# Patient Record
Sex: Male | Born: 1955 | Race: White | Hispanic: No | Smoking: Never smoker
Health system: Southern US, Community
[De-identification: ages and names within clinical notes are randomized; demographics above are authoritative.]

---

## 1997-12-01 ENCOUNTER — Emergency Department (HOSPITAL_COMMUNITY): Admission: EM | Admit: 1997-12-01 | Discharge: 1997-12-01 | Payer: Self-pay | Admitting: Emergency Medicine

## 2008-09-22 ENCOUNTER — Emergency Department (HOSPITAL_COMMUNITY): Admission: EM | Admit: 2008-09-22 | Discharge: 2008-09-22 | Payer: Self-pay | Admitting: Emergency Medicine

## 2009-05-18 ENCOUNTER — Emergency Department (HOSPITAL_COMMUNITY): Admission: EM | Admit: 2009-05-18 | Discharge: 2009-05-18 | Payer: Self-pay | Admitting: Emergency Medicine

## 2009-05-19 ENCOUNTER — Ambulatory Visit (HOSPITAL_COMMUNITY): Admission: RE | Admit: 2009-05-19 | Discharge: 2009-05-19 | Payer: Self-pay | Admitting: Ophthalmology

## 2010-07-18 IMAGING — CT CT PELVIS W/O CM
1 series · 14 of 32 positions shown, 18 images · non-contrast
Comparison: None.

CT ABDOMEN

CLINICAL DATA: Left flank pain radiating into the left groin.

CT ABDOMEN AND PELVIS WITHOUT CONTRAST 09/22/2008:
TECHNIQUE: Multidetector CT imaging of the abdomen and pelvis was
performed following the standard protocol without intravenous
contrast.

[Series 4: lung windows · axial · 0.80mm/px · z∈[-479,-59]mm · 14 of 95 slices shown, 18 images]
[im 7/95  soft-tissue]
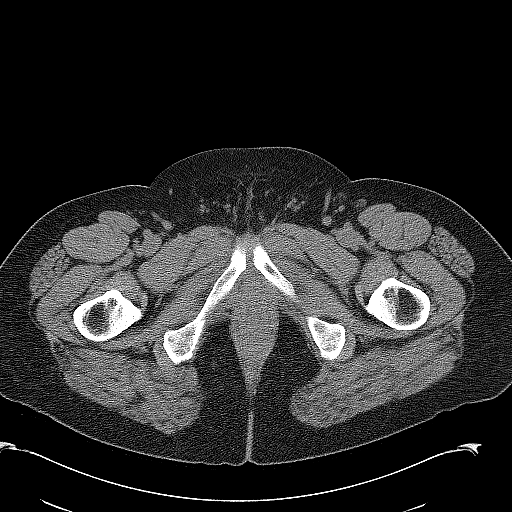
[im 7/95  bone]
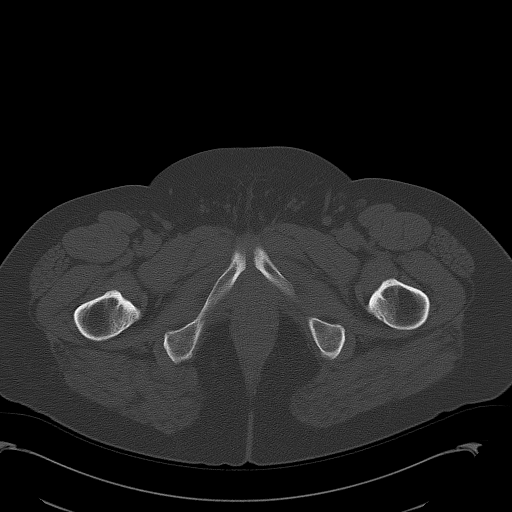
[im 13/95  soft-tissue]
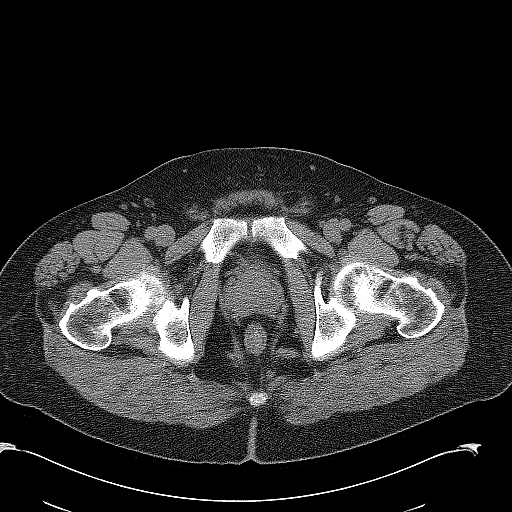
[im 22/95  soft-tissue]
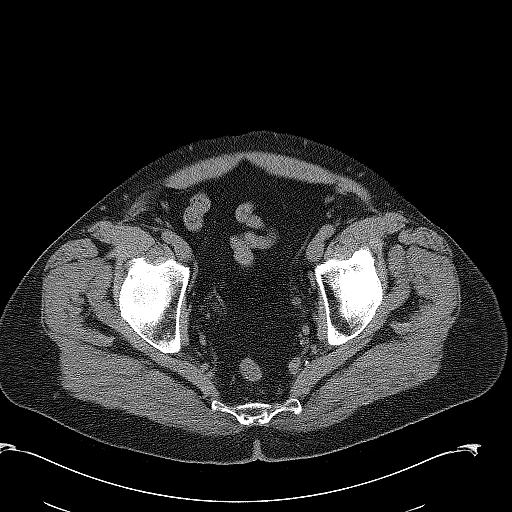
[im 28/95  soft-tissue]
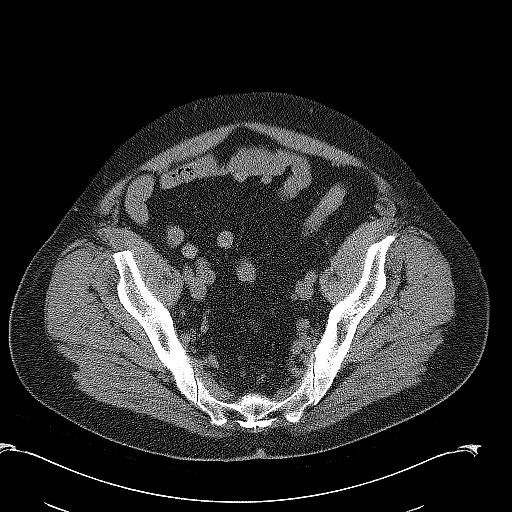
[im 37/95  soft-tissue]
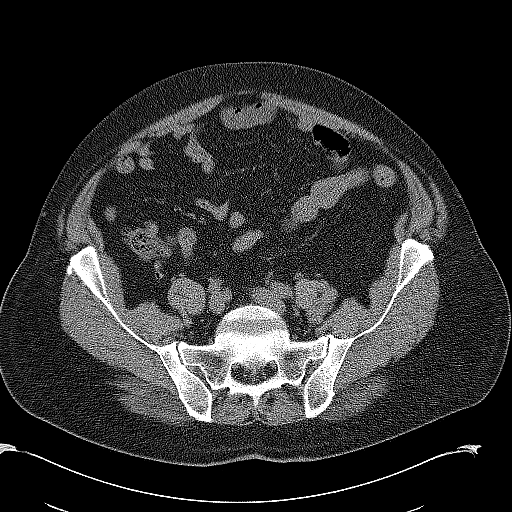
[im 43/95  soft-tissue]
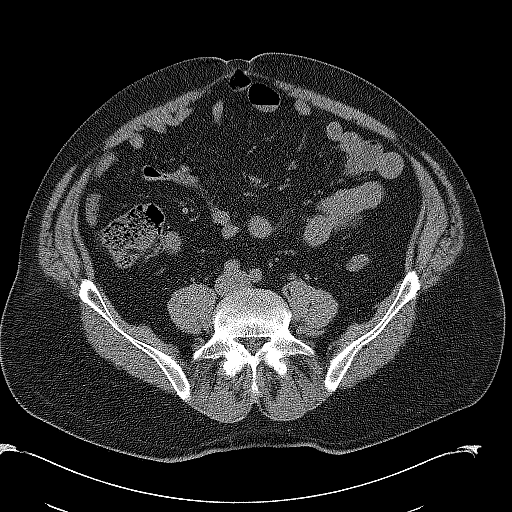
[im 52/95  soft-tissue]
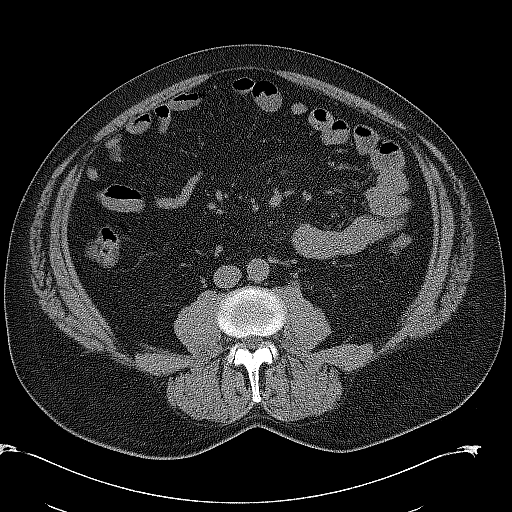
[im 58/95  soft-tissue]
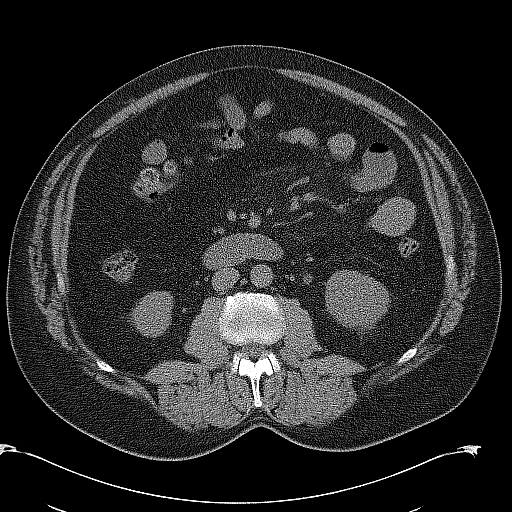
[im 67/95  soft-tissue]
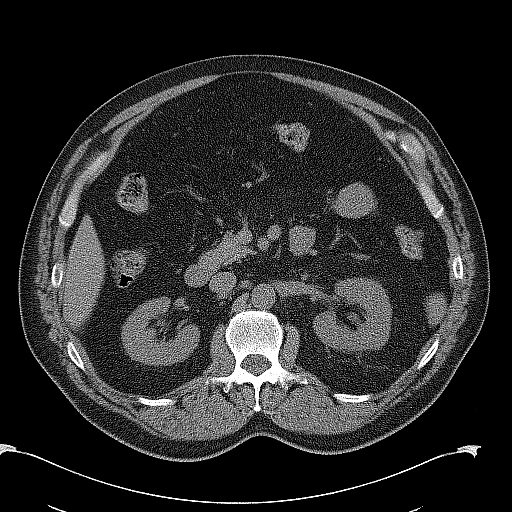
[im 67/95  bone]
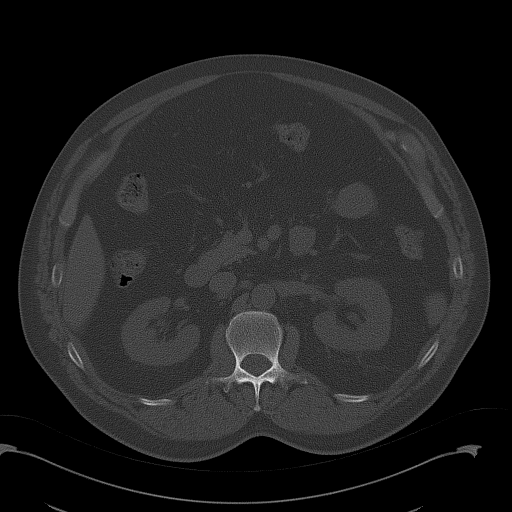
[im 73/95  soft-tissue]
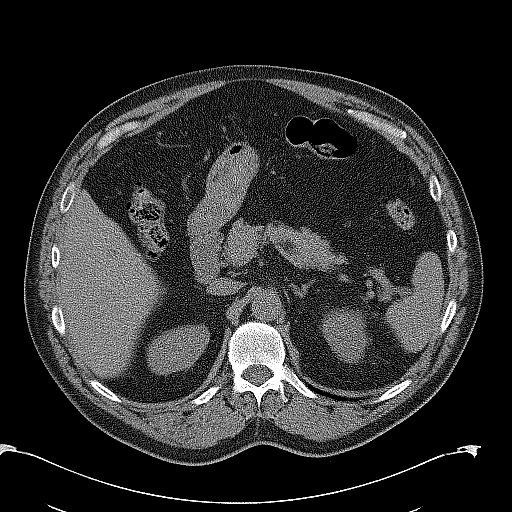
[im 82/95  soft-tissue]
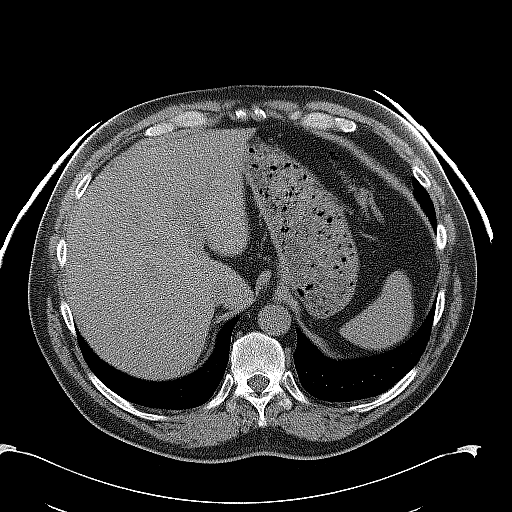
[im 82/95  lung]
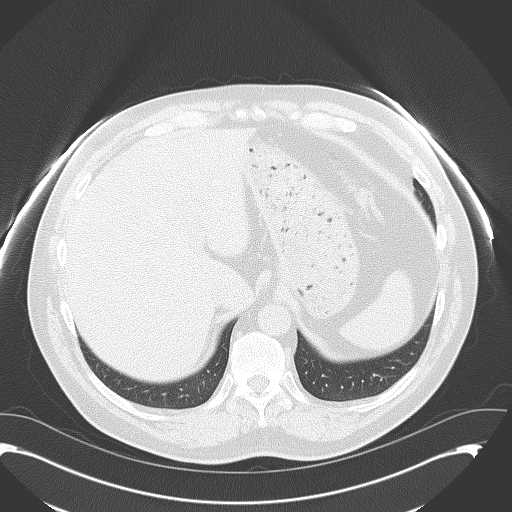
[im 85/95  lung]
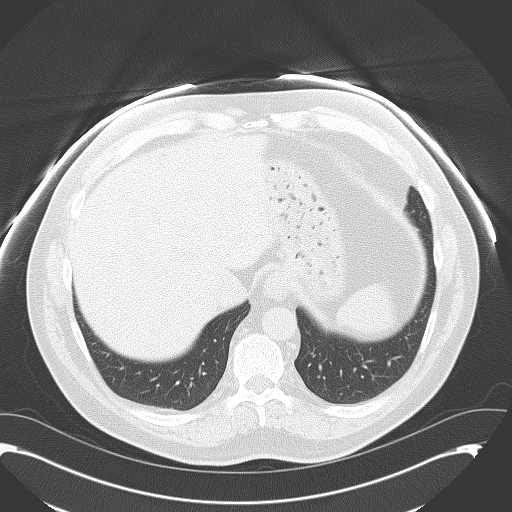
[im 88/95  soft-tissue]
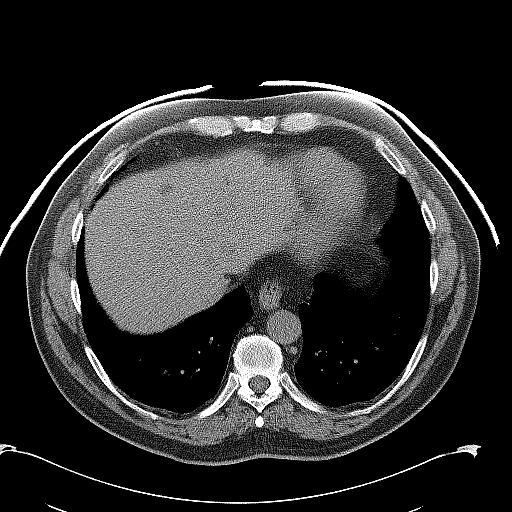
[im 88/95  lung]
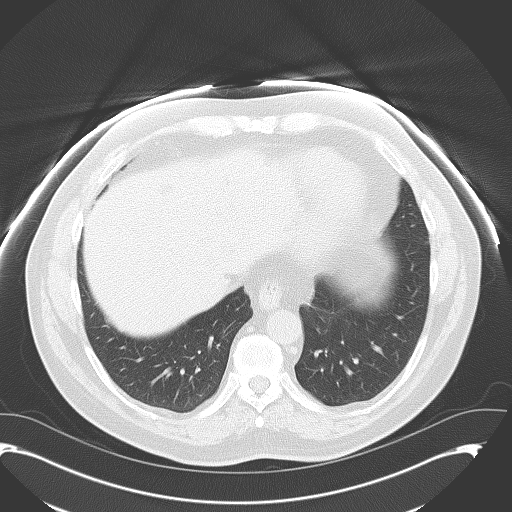
[im 91/95  lung]
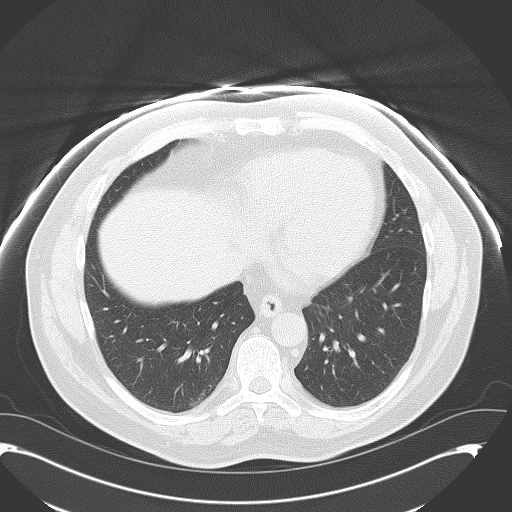

[14 of 32 positions shown; findings below may reference images not displayed]

FINDINGS: Mild left hydronephrosis and dilation of the proximal
left ureter.  No proximal left ureteral calculus.  Approximate 7 mm
calculus in a lower pole calix of the left kidney.  No right upper
urinary tract calculi or obstruction.  Hyperdense cyst arising from
the mid left kidney laterally.  Small hyperdense cyst arising from
the lower pole of the left kidney.  No focal parenchymal
abnormality involving the right kidney.

Numerous low attenuation foci throughout the liver, the largest in
the medial segment left lobe with a Hounsfield measurement of 6,
consistent with a small cyst; I suspect that all of the lesions are
cysts, given their conspicuity for their small size and the
unenhanced technique.  Normal unenhanced appearance of the spleen,
pancreas, and adrenal glands.  Gallbladder unremarkable by CT.  No
biliary ductal dilation.  Stomach and visualized small bowel and
colon unremarkable.  Normal appearing abdominal aorta.  No
significant lymphadenopathy.  No ascites.  Bone window images
demonstrate facet degenerative changes in the lower lumbar spine.
IMPRESSION: 1.  Mild obstruction of the left upper urinary tract.  No proximal
left ureteral calculus.
2.  7 mm left lower pole renal calculus.
3.  Hyperdense cysts (hemorrhagic or proteinaceous) arising from
the midportion and lower pole of the left kidney.
4.  Numerous hepatic cysts.

CT PELVIS
FINDINGS: Approximate 3 mm calculus involving the intramural
portion of the left ureter at the UVJ, accounting for the left
upper urinary tract obstruction.  Urinary bladder decompressed and
unremarkable.  Borderline median lobe prostate gland enlargement.
Seminal vesicles unremarkable.  Minimal left internal iliac artery
atherosclerosis.  No significant lymphadenopathy.  No ascites.
Visualized colon and small bowel unremarkable.  Normal appendix in
the right upper pelvis.  Bone window images suggest early
degenerative changes in the hips.
IMPRESSION: 1.  Obstructing approximate 3 mm left ureteral vesicle junction
calculus.
2.  Borderline median lobe prostate gland enlargement.

## 2010-08-29 LAB — CBC
HCT: 45.9 % (ref 39.0–52.0)
Hemoglobin: 15.7 g/dL (ref 13.0–17.0)
MCV: 85.6 fL (ref 78.0–100.0)
WBC: 13 10*3/uL — ABNORMAL HIGH (ref 4.0–10.5)

## 2010-08-29 LAB — BASIC METABOLIC PANEL
CO2: 23 mEq/L (ref 19–32)
Chloride: 106 mEq/L (ref 96–112)
Potassium: 4.1 mEq/L (ref 3.5–5.1)

## 2010-09-07 LAB — URINALYSIS, ROUTINE W REFLEX MICROSCOPIC
Bilirubin Urine: NEGATIVE
Glucose, UA: NEGATIVE mg/dL
Specific Gravity, Urine: 1.028 (ref 1.005–1.030)
pH: 7 (ref 5.0–8.0)

## 2010-09-07 LAB — URINE MICROSCOPIC-ADD ON

## 2010-09-07 LAB — POCT I-STAT, CHEM 8
Glucose, Bld: 135 mg/dL — ABNORMAL HIGH (ref 70–99)
HCT: 45 % (ref 39.0–52.0)
Hemoglobin: 15.3 g/dL (ref 13.0–17.0)
Potassium: 4.1 mEq/L (ref 3.5–5.1)
Sodium: 141 mEq/L (ref 135–145)

## 2010-12-15 ENCOUNTER — Inpatient Hospital Stay (INDEPENDENT_AMBULATORY_CARE_PROVIDER_SITE_OTHER)
Admission: RE | Admit: 2010-12-15 | Discharge: 2010-12-15 | Disposition: A | Discharge status: Home / Self Care | Payer: Self-pay | Source: Ambulatory Visit | Attending: Emergency Medicine | Admitting: Emergency Medicine

## 2010-12-15 DIAGNOSIS — N201 Calculus of ureter: Secondary | ICD-10-CM

## 2010-12-15 LAB — POCT URINALYSIS DIP (DEVICE)
Glucose, UA: NEGATIVE mg/dL
Ketones, ur: NEGATIVE mg/dL
Leukocytes, UA: NEGATIVE
Protein, ur: 100 mg/dL — AB

## 2011-03-14 IMAGING — CR DG CHEST 2V
2 series · 2 of 2 positions shown · non-contrast
Comparison: None

CLINICAL DATA: Preoperative evaluation for detached retina.
Nonsmoker.  No hypertension

CHEST - 2 VIEW

[view not recorded (1 of 2)]
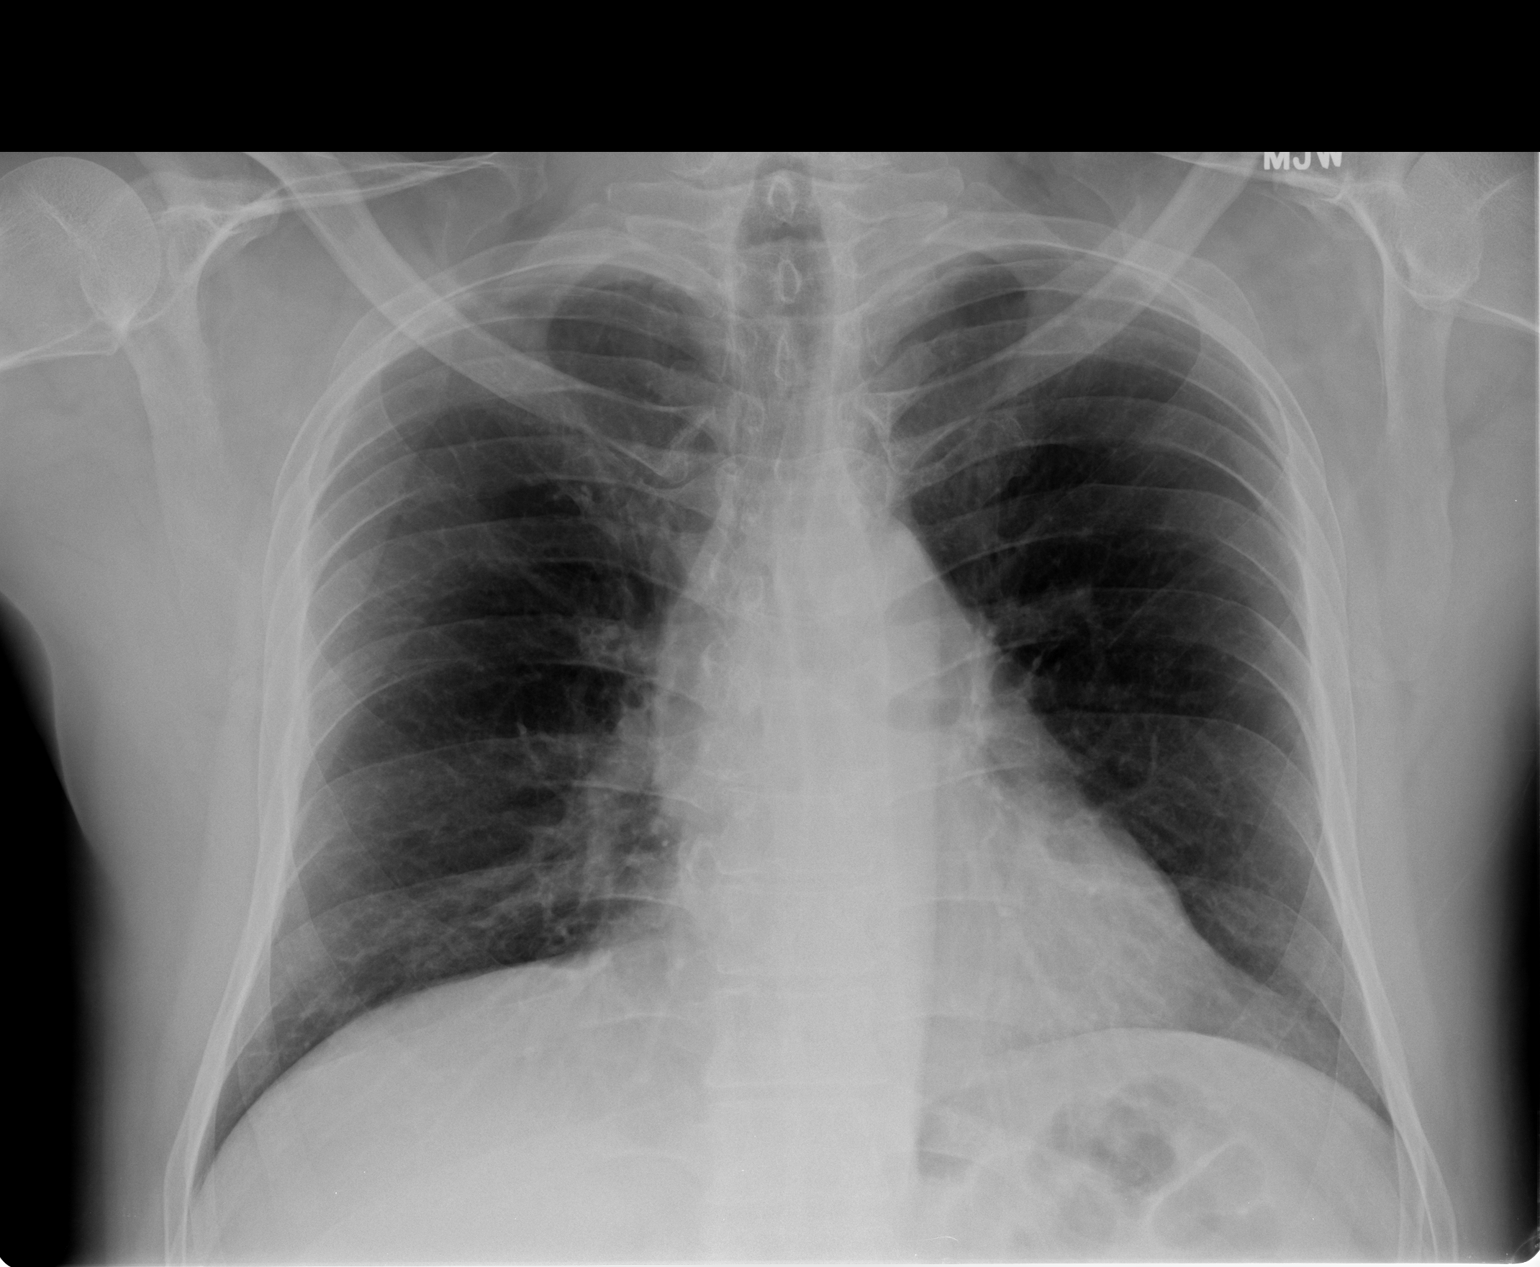

[view not recorded (2 of 2)]
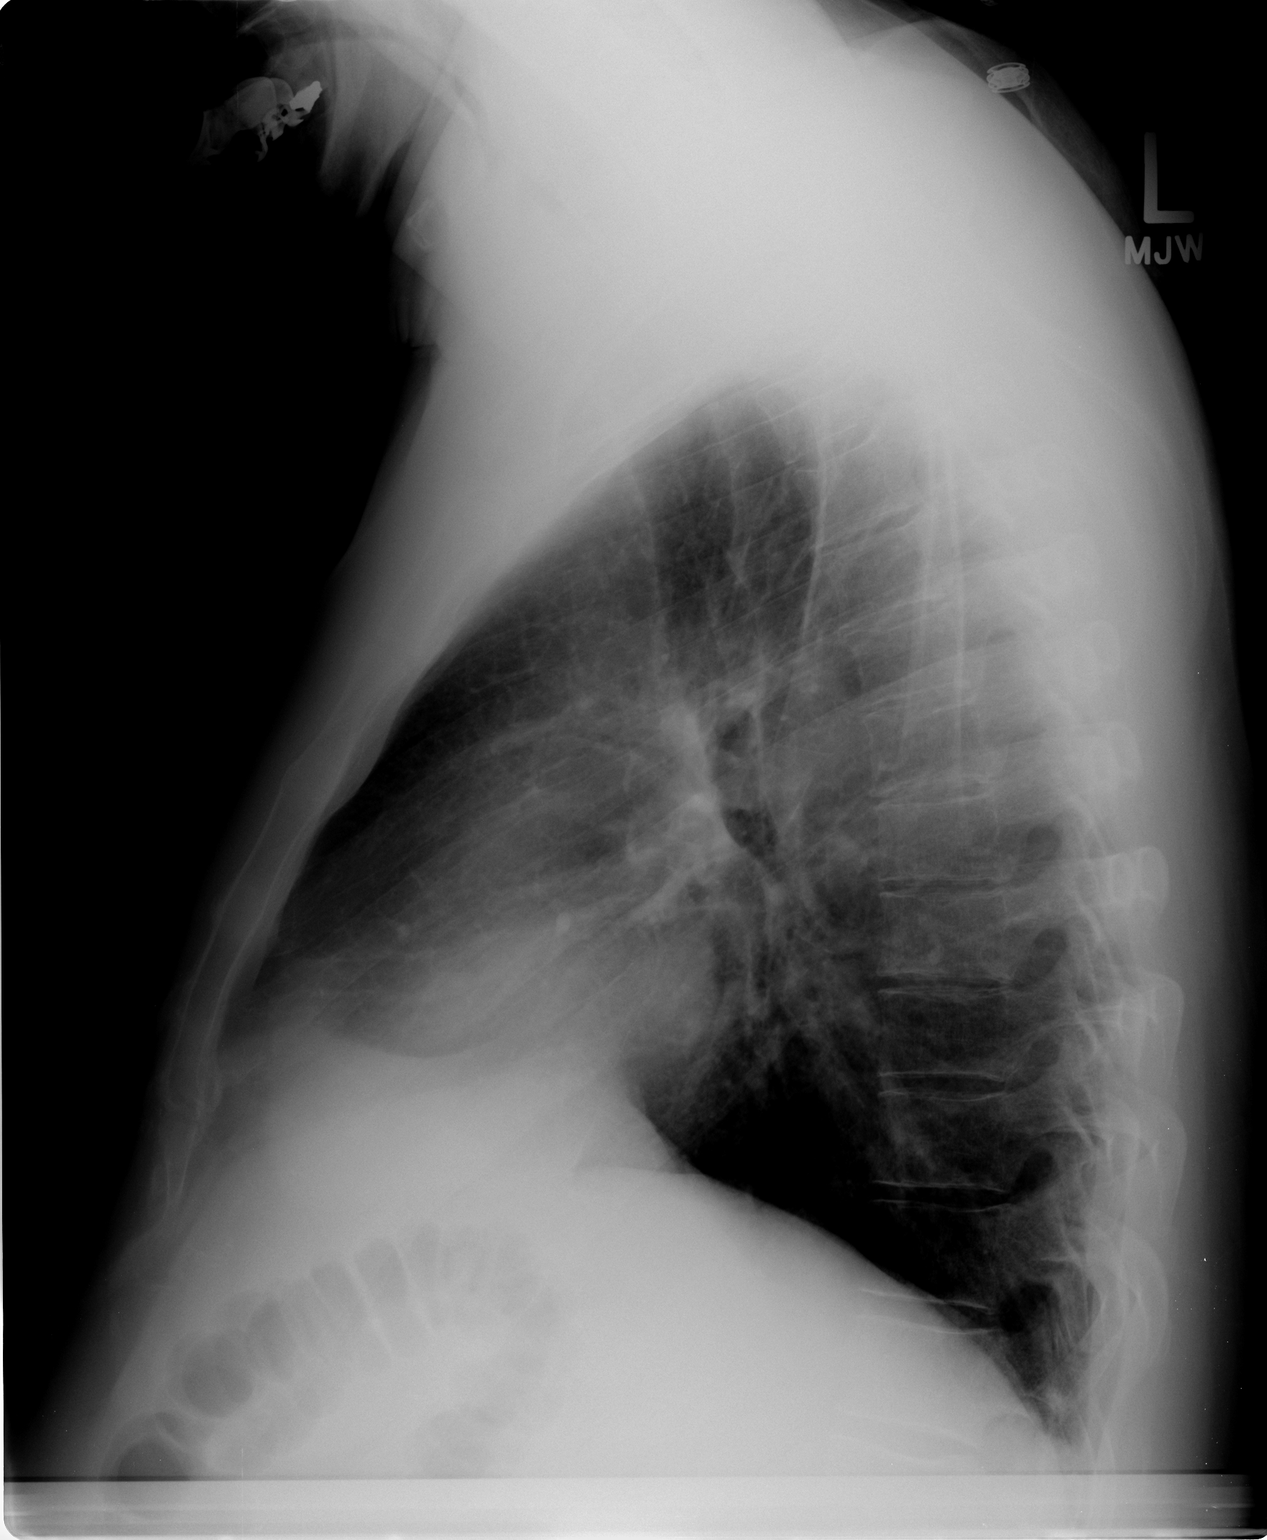

[2 of 2 positions shown; findings below may reference images not displayed]

FINDINGS: Heart size is within normal limits.  A normal mediastinal
configuration is seen.  The lung fields appear clear with no signs
of focal infiltrate or congestive failure.  Some mild peribronchial
cuffing is seen centrally.  No pleural fluid is seen.  Bony
structures appear intact. Mild bilateral apical pleural thickening
is seen right greater than left.
IMPRESSION: Mild central peribronchial cuffing which can be seen with
bronchitis or chronic change.  Otherwise unremarkable.

## 2020-03-08 ENCOUNTER — Encounter (HOSPITAL_COMMUNITY): Payer: Self-pay

## 2020-03-08 ENCOUNTER — Other Ambulatory Visit: Payer: Self-pay

## 2020-03-08 ENCOUNTER — Inpatient Hospital Stay (HOSPITAL_COMMUNITY)
Admission: EM | Admit: 2020-03-08 | Discharge: 2020-03-10 | DRG: 812 | Disposition: A | Discharge status: Home / Self Care | Payer: Medicare Other | Attending: Internal Medicine | Admitting: Internal Medicine

## 2020-03-08 DIAGNOSIS — Z20822 Contact with and (suspected) exposure to covid-19: Secondary | ICD-10-CM | POA: Diagnosis present

## 2020-03-08 DIAGNOSIS — D649 Anemia, unspecified: Secondary | ICD-10-CM | POA: Diagnosis not present

## 2020-03-08 DIAGNOSIS — R079 Chest pain, unspecified: Secondary | ICD-10-CM

## 2020-03-08 DIAGNOSIS — K648 Other hemorrhoids: Secondary | ICD-10-CM | POA: Diagnosis present

## 2020-03-08 DIAGNOSIS — D509 Iron deficiency anemia, unspecified: Secondary | ICD-10-CM | POA: Diagnosis not present

## 2020-03-08 DIAGNOSIS — Z7982 Long term (current) use of aspirin: Secondary | ICD-10-CM

## 2020-03-08 DIAGNOSIS — K449 Diaphragmatic hernia without obstruction or gangrene: Secondary | ICD-10-CM | POA: Diagnosis present

## 2020-03-08 DIAGNOSIS — K2970 Gastritis, unspecified, without bleeding: Secondary | ICD-10-CM | POA: Diagnosis present

## 2020-03-08 DIAGNOSIS — K317 Polyp of stomach and duodenum: Secondary | ICD-10-CM | POA: Diagnosis present

## 2020-03-08 LAB — ABO/RH: ABO/RH(D): A POS

## 2020-03-08 LAB — CBC
HCT: 20.1 % — ABNORMAL LOW (ref 39.0–52.0)
Hemoglobin: 5.3 g/dL — CL (ref 13.0–17.0)
MCH: 15.8 pg — ABNORMAL LOW (ref 26.0–34.0)
MCHC: 26.4 g/dL — ABNORMAL LOW (ref 30.0–36.0)
MCV: 59.8 fL — ABNORMAL LOW (ref 80.0–100.0)
Platelets: 346 10*3/uL (ref 150–400)
RBC: 3.36 MIL/uL — ABNORMAL LOW (ref 4.22–5.81)
RDW: 24.8 % — ABNORMAL HIGH (ref 11.5–15.5)
WBC: 8.4 10*3/uL (ref 4.0–10.5)
nRBC: 0 % (ref 0.0–0.2)

## 2020-03-08 LAB — COMPREHENSIVE METABOLIC PANEL
ALT: 12 U/L (ref 0–44)
AST: 11 U/L — ABNORMAL LOW (ref 15–41)
Albumin: 4.2 g/dL (ref 3.5–5.0)
Alkaline Phosphatase: 98 U/L (ref 38–126)
Anion gap: 12 (ref 5–15)
BUN: 23 mg/dL (ref 8–23)
CO2: 23 mmol/L (ref 22–32)
Calcium: 8.9 mg/dL (ref 8.9–10.3)
Chloride: 106 mmol/L (ref 98–111)
Creatinine, Ser: 0.89 mg/dL (ref 0.61–1.24)
GFR, Estimated: >60 mL/min (ref >60)
Glucose, Bld: 123 mg/dL — ABNORMAL HIGH (ref 70–99)
Potassium: 3.9 mmol/L (ref 3.5–5.1)
Sodium: 141 mmol/L (ref 135–145)
Total Bilirubin: 0.9 mg/dL (ref 0.3–1.2)
Total Protein: 7.5 g/dL (ref 6.5–8.1)

## 2020-03-08 LAB — RETICULOCYTES
Immature Retic Fract: 29.9 % — ABNORMAL HIGH (ref 2.3–15.9)
RBC.: 3.39 MIL/uL — ABNORMAL LOW (ref 4.22–5.81)
Retic Count, Absolute: 75.9 10*3/uL (ref 19.0–186.0)
Retic Ct Pct: 2.2 % (ref 0.4–3.1)

## 2020-03-08 LAB — URINALYSIS, ROUTINE W REFLEX MICROSCOPIC
Bilirubin Urine: NEGATIVE
Glucose, UA: NEGATIVE mg/dL
Hgb urine dipstick: NEGATIVE
Ketones, ur: NEGATIVE mg/dL
Leukocytes,Ua: NEGATIVE
Nitrite: NEGATIVE
Protein, ur: NEGATIVE mg/dL
Specific Gravity, Urine: 1.012 (ref 1.005–1.030)
pH: 6 (ref 5.0–8.0)

## 2020-03-08 LAB — RESP PANEL BY RT PCR (RSV, FLU A&B, COVID)
Influenza A by PCR: NEGATIVE
Influenza B by PCR: NEGATIVE
Respiratory Syncytial Virus by PCR: NEGATIVE
SARS Coronavirus 2 by RT PCR: NEGATIVE

## 2020-03-08 LAB — PREPARE RBC (CROSSMATCH)

## 2020-03-08 LAB — POC OCCULT BLOOD, ED: Fecal Occult Bld: NEGATIVE

## 2020-03-08 MED ORDER — SODIUM CHLORIDE 0.9% IV SOLUTION: Freq: Once | INTRAVENOUS | Status: DC

## 2020-03-08 NOTE — ED Triage Notes (Addendum)
Pt arrived POV. Patient sent over from PCP for low labs. Pt went to PCP for chest pain Friday and labs were drawn.   Hemoglobin- 4.9    Denies symptoms.  Denies chest pain, shob, and weakness.   A/ox4 Ambulatory in triage   Pt had a fall 2 months ago.

## 2020-03-08 NOTE — ED Provider Notes (Signed)
[facility]-EMERGENCY DEPT Provider Note   CSN: [id number] Arrival date & time: 03/08/20  1231     History Chief Complaint  Patient presents with  . Abnormal Lab    Low Hemaglobin     Tyler Hebert is a 64 y.o. male.  Patient is a 64 year old male with no significant medical problems who takes no medications at home presenting today due to an abnormal lab.  Patient reports that he was called today while at work and told that his hemoglobin was 4.9 and he needed to go to the emergency room immediately.  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  He reports the pain will be on the left side and become severe and then resolved in 5 minutes or less.  He has not noticed any shortness of breath but has noticed over the last few months he has been tired which he relates to just working hard.  He has had no change in his bowel or bladder habits.  He has had no black stools.  He does not take aspirin or ibuprofen products regularly and is on no anticoagulation.  No prior history of cardiac disease but reports that his dad did have a heart history.  Patient denies alcohol drug or tobacco use.  He currently denies any pain or shortness of breath at this time.  The history is provided by the patient.  Abnormal Lab Time since result:  Friday Patient referred by:  PCP Resulting agency:  External Result type: hematology   Hematology:    Hematocrit:  Low   Hemoglobin:  Low   Other abnormal hematology result:  Hb of 4.9      History reviewed. No pertinent past medical history.  There are no problems to display for this patient.   History reviewed. No pertinent surgical history.     No family history on file.  Social History   Tobacco Use  . Smoking status: Never Smoker  . Smokeless tobacco: Never Used  Substance Use Topics  . Alcohol use: Not on file  .  Drug use: Not on file    Home Medications Prior to Admission medications   Not on File    Allergies    Patient has no allergy information on record.  Review of Systems   Review of Systems  All other systems reviewed and are negative.   Physical Exam Updated Vital Signs BP (!) 144/72 (BP Location: Left Arm)   Pulse 97   Temp 98.1 F (36.7 C) (Oral)   Resp (!) 22   SpO2 96%   Physical Exam Vitals and nursing note reviewed.  Constitutional:      General: He is not in acute distress.    Appearance: Normal appearance. He is well-developed and normal weight.  HENT:     Head: Normocephalic and atraumatic.     Mouth/Throat:     Mouth: Mucous membranes are moist.  Eyes:     Pupils: Pupils are equal, round, and reactive to light.     Comments: Pale conjunctive  Cardiovascular:     Rate and Rhythm: Normal rate and regular rhythm.     Heart sounds: No murmur heard.   Pulmonary:     Effort: Pulmonary effort is normal. No respiratory distress.     Breath sounds: Normal breath sounds. No wheezing or rales.  Abdominal:     General:  There is no distension.     Palpations: Abdomen is soft.     Tenderness: There is no abdominal tenderness. There is no guarding or rebound.  Musculoskeletal:        General: No tenderness. Normal range of motion.     Cervical back: Normal range of motion and neck supple.     Right lower leg: No edema.     Left lower leg: No edema.  Skin:    General: Skin is warm and dry.     Coloration: Skin is pale.     Findings: No erythema or rash.  Neurological:     General: No focal deficit present.     Mental Status: He is alert and oriented to person, place, and time. Mental status is at baseline.  Psychiatric:        Mood and Affect: Mood normal.        Behavior: Behavior normal.        Thought Content: Thought content normal.      ED Results / Procedures / Treatments   Labs (all labs ordered are listed, but only abnormal results are  displayed) Labs Reviewed  CBC - Abnormal; Notable for the following components:      Result Value   RBC 3.36 (*)    Hemoglobin 5.3 (*)    HCT 20.1 (*)    MCV 59.8 (*)    MCH 15.8 (*)    MCHC 26.4 (*)    RDW 24.8 (*)    All other components within normal limits  COMPREHENSIVE METABOLIC PANEL - Abnormal; Notable for the following components:   Glucose, Bld 123 (*)    AST 11 (*)    All other components within normal limits  URINALYSIS, ROUTINE W REFLEX MICROSCOPIC  VITAMIN B12  FOLATE  IRON AND TIBC  FERRITIN  RETICULOCYTES  POC OCCULT BLOOD, ED  TYPE AND SCREEN  PREPARE RBC (CROSSMATCH)    EKG EKG Interpretation  Date/Time:  Monday March 08 2020 12:52:20 EDT Ventricular Rate:  95 PR Interval:    QRS Duration: 100 QT Interval:  362 QTC Calculation: 456 R Axis:   50 Text Interpretation: Sinus rhythm 12 Lead; Mason-Likar No significant change since last tracing Confirmed by Gwyneth Sprout ([id number]) on 03/08/2020 1:36:07 PM   Radiology No results found.  Procedures Procedures (including critical care time)  Medications Ordered in ED Medications  0.9 %  sodium chloride infusion (Manually program via Guardrails IV Fluids) (has no administration in time range)    ED Course  I have reviewed the triage vital signs and the nursing notes.  Pertinent labs & imaging results that were available during my care of the patient were reviewed by me and considered in my medical decision making (see chart for details).    MDM Rules/Calculators/A&P                          Patient presenting today with anemia with a hemoglobin on recheck of 5.3 of unknown origin.  Patient is Hemoccult negative and denies any recent stool changes.  No evidence of thrombocytopenia or leukopenia.  Patient has no abdominal pain and at this time denies any chest pain or shortness of breath.  Patient will need transfusion but also anemia panel was sent.  Will call for admission.  CMP without acute  findings.  MDM Number of Diagnoses or Management Options   Amount and/or Complexity of Data Reviewed Clinical lab tests: ordered and reviewed  Tests in the medicine section of CPT: ordered and reviewed Independent visualization of images, tracings, or specimens: yes  Risk of Complications, Morbidity, and/or Mortality Presenting problems: moderate Diagnostic procedures: low Management options: moderate  Patient Progress Patient progress: stable  CRITICAL CARE Performed by: Chanie Soucek Total critical care time: 30 minutes Critical care time was exclusive of separately billable procedures and treating other patients. Critical care was necessary to treat or prevent imminent or life-threatening deterioration. Critical care was time spent personally by me on the following activities: development of treatment plan with patient and/or surrogate as well as nursing, discussions with consultants, evaluation of patient's response to treatment, examination of patient, obtaining history from patient or surrogate, ordering and performing treatments and interventions, ordering and review of laboratory studies, ordering and review of radiographic studies, pulse oximetry and re-evaluation of patient's condition.  Final Clinical Impression(s) / ED Diagnoses Final diagnoses:  Symptomatic anemia    Rx / DC Orders ED Discharge Orders    None       Gwyneth Sprout, MD 03/08/20 1355

## 2020-03-08 NOTE — H&P (Signed)
History and Physical    Gershom Brobeck EXN:[id number] DOB: Sep 15, 1955 DOA: 03/08/2020  PCP: Pcp, No  Patient coming from: Home  Chief Complaint: symptomatic anemia  HPI: Amadeo Coke is a 64 y.o. male with no past medical history. Presenting with intermittent left arm pit pain over the last several months. It is sharp and radiates across the top of his chest. It lasts about 5 minutes. He thought initially it was because of lifting items at work (he works in Teaching laboratory technician and receiving). So he visited his doctor 3 days ago and they ran blood work and looked at an EKG. His EKG was ok. However, he was called to day and told to got to the ED due to a low hemoglobin (4.9). He denies any aggravating or alleviating factors. He denies any other treatments.    ED Course: CBC showed a Hgb of 5.3 and MCV of 59. He was hemoccult negative. BUN was normal. 2 units pRBCs ordered. TRH called for admission.  Review of Systems:  Reports fatigue with intermittent dyspnea. Denies palpitations, syncopal episodes, hematemesis, hematochezia. Denies family and personal Hx of any blood disorders/cancerReview of systems is otherwise negative for all not mentioned in HPI.   PMHx History reviewed. No pertinent past medical history.  PSHx History reviewed. No pertinent surgical history.  SocHx  reports that he has never smoked. He has never used smokeless tobacco. No history on file for alcohol use and drug use.  Not on File  FamHx No family history on file.  Prior to Admission medications   Not on File    Physical Exam: Vitals:   03/08/20 1245  BP: (!) 144/72  Pulse: 97  Resp: (!) 22  Temp: 98.1 F (36.7 C)  TempSrc: Oral  SpO2: 96%    General: 64 y.o. male resting in bed in NAD Eyes: PERRL, normal sclera ENMT: Nares patent w/o discharge, orophaynx clear, dentition normal, ears w/o discharge/lesions/ulcers Neck: Supple, trachea midline Cardiovascular: RRR, +S1, S2, no m/g/r, equal pulses  throughout Respiratory: CTABL, no w/r/r, normal WOB GI: BS+, NDNT, no masses noted, no organomegaly noted MSK: No e/c/c Skin: No rashes, bruises, ulcerations noted Neuro: A&O x 3, no focal deficits Psyc: Appropriate interaction and affect, calm/cooperative  Labs on Admission: I have personally reviewed following labs and imaging studies  CBC: Recent Labs  Lab 03/08/20 1302  WBC 8.4  HGB 5.3*  HCT 20.1*  MCV 59.8*  PLT 346   Basic Metabolic Panel: Recent Labs  Lab 03/08/20 1302  NA 141  K 3.9  CL 106  CO2 23  GLUCOSE 123*  BUN 23  CREATININE 0.89  CALCIUM 8.9   GFR: CrCl cannot be calculated (Unknown ideal weight.). Liver Function Tests: Recent Labs  Lab 03/08/20 1302  AST 11*  ALT 12  ALKPHOS 98  BILITOT 0.9  PROT 7.5  ALBUMIN 4.2   No results for input(s): LIPASE, AMYLASE in the last 168 hours. No results for input(s): AMMONIA in the last 168 hours. Coagulation Profile: No results for input(s): INR, PROTIME in the last 168 hours. Cardiac Enzymes: No results for input(s): CKTOTAL, CKMB, CKMBINDEX, TROPONINI in the last 168 hours. BNP (last 3 results) No results for input(s): PROBNP in the last 8760 hours. HbA1C: No results for input(s): HGBA1C in the last 72 hours. CBG: No results for input(s): GLUCAP in the last 168 hours. Lipid Profile: No results for input(s): CHOL, HDL, LDLCALC, TRIG, CHOLHDL, LDLDIRECT in the last 72 hours. Thyroid Function Tests: No results for input(s):  TSH, T4TOTAL, FREET4, T3FREE, THYROIDAB in the last 72 hours. Anemia Panel: No results for input(s): VITAMINB12, FOLATE, FERRITIN, TIBC, IRON, RETICCTPCT in the last 72 hours. Urine analysis:    Component Value Date/Time   COLORURINE YELLOW 09/22/2008 2028   APPEARANCEUR CLOUDY (A) 09/22/2008 2028   LABSPEC 1.025 12/15/2010 1947   PHURINE 7.0 12/15/2010 1947   GLUCOSEU NEGATIVE 12/15/2010 1947   HGBUR LARGE (A) 12/15/2010 1947   BILIRUBINUR SMALL (A) 12/15/2010 1947    KETONESUR NEGATIVE 12/15/2010 1947   PROTEINUR 100 (A) 12/15/2010 1947   UROBILINOGEN 1.0 12/15/2010 1947   NITRITE NEGATIVE 12/15/2010 1947   LEUKOCYTESUR NEGATIVE 12/15/2010 1947    Radiological Exams on Admission: No results found.  EKG: Independently reviewed. NSR, no st changes  Assessment/Plan Symptomatic anemia CP     - admit to obs; telemetry     - 2 units pRBCs ordered by ED; follow H&H     - BUN ok, FOBT negative     - MCV is significantly low: check iron studies prior to transfusion, get peripheral smear     - check TSH, LDH, haptoglobin     - bilirubin is ok     - may need hematology consult while in-house     - CP is likely secondary to low Hgb; EKG is ok, cycle a set of troponins  DVT prophylaxis: SCDs  Code Status: FULL  Family Communication: None at bedside  Consults called: None  Admission status: Observation  Status is: Observation  The patient remains OBS appropriate and will d/c before 2 midnights.  Dispo: The patient is from: Home              Anticipated d/c is to: Home              Anticipated d/c date is: 1 day              Patient currently is not medically stable to d/c.  Teddy Spike DO [facility]  If 7PM-7AM, please contact night-coverage [email or web address]  03/08/2020, 2:10 PM

## 2020-03-09 DIAGNOSIS — K648 Other hemorrhoids: Secondary | ICD-10-CM | POA: Diagnosis present

## 2020-03-09 DIAGNOSIS — D509 Iron deficiency anemia, unspecified: Secondary | ICD-10-CM | POA: Diagnosis present

## 2020-03-09 DIAGNOSIS — D649 Anemia, unspecified: Secondary | ICD-10-CM | POA: Diagnosis present

## 2020-03-09 DIAGNOSIS — Z20822 Contact with and (suspected) exposure to covid-19: Secondary | ICD-10-CM | POA: Diagnosis present

## 2020-03-09 DIAGNOSIS — K449 Diaphragmatic hernia without obstruction or gangrene: Secondary | ICD-10-CM | POA: Diagnosis present

## 2020-03-09 DIAGNOSIS — Z7982 Long term (current) use of aspirin: Secondary | ICD-10-CM | POA: Diagnosis not present

## 2020-03-09 DIAGNOSIS — K317 Polyp of stomach and duodenum: Secondary | ICD-10-CM | POA: Diagnosis present

## 2020-03-09 DIAGNOSIS — K2970 Gastritis, unspecified, without bleeding: Secondary | ICD-10-CM | POA: Diagnosis present

## 2020-03-09 LAB — TROPONIN I (HIGH SENSITIVITY): Troponin I (High Sensitivity): 7 ng/L (ref <18)

## 2020-03-09 LAB — CBC
HCT: 23.5 % — ABNORMAL LOW (ref 39.0–52.0)
HCT: 24 % — ABNORMAL LOW (ref 39.0–52.0)
Hemoglobin: 6.6 g/dL — CL (ref 13.0–17.0)
Hemoglobin: 6.9 g/dL — CL (ref 13.0–17.0)
MCH: 18 pg — ABNORMAL LOW (ref 26.0–34.0)
MCH: 18.4 pg — ABNORMAL LOW (ref 26.0–34.0)
MCHC: 28.1 g/dL — ABNORMAL LOW (ref 30.0–36.0)
MCHC: 28.8 g/dL — ABNORMAL LOW (ref 30.0–36.0)
MCV: 63.8 fL — ABNORMAL LOW (ref 80.0–100.0)
MCV: 64 fL — ABNORMAL LOW (ref 80.0–100.0)
Platelets: 301 10*3/uL (ref 150–400)
Platelets: 303 10*3/uL (ref 150–400)
RBC: 3.67 MIL/uL — ABNORMAL LOW (ref 4.22–5.81)
RBC: 3.76 MIL/uL — ABNORMAL LOW (ref 4.22–5.81)
RDW: 28.7 % — ABNORMAL HIGH (ref 11.5–15.5)
RDW: 28.8 % — ABNORMAL HIGH (ref 11.5–15.5)
WBC: 7.2 10*3/uL (ref 4.0–10.5)
WBC: 7.5 10*3/uL (ref 4.0–10.5)
nRBC: 0 % (ref 0.0–0.2)
nRBC: 0 % (ref 0.0–0.2)

## 2020-03-09 LAB — COMPREHENSIVE METABOLIC PANEL
ALT: 11 U/L (ref 0–44)
AST: 11 U/L — ABNORMAL LOW (ref 15–41)
Albumin: 3.6 g/dL (ref 3.5–5.0)
Alkaline Phosphatase: 88 U/L (ref 38–126)
Anion gap: 9 (ref 5–15)
BUN: 17 mg/dL (ref 8–23)
CO2: 24 mmol/L (ref 22–32)
Calcium: 8.8 mg/dL — ABNORMAL LOW (ref 8.9–10.3)
Chloride: 105 mmol/L (ref 98–111)
Creatinine, Ser: 0.87 mg/dL (ref 0.61–1.24)
GFR, Estimated: >60 mL/min (ref >60)
Glucose, Bld: 104 mg/dL — ABNORMAL HIGH (ref 70–99)
Potassium: 4.1 mmol/L (ref 3.5–5.1)
Sodium: 138 mmol/L (ref 135–145)
Total Bilirubin: 1.6 mg/dL — ABNORMAL HIGH (ref 0.3–1.2)
Total Protein: 6.8 g/dL (ref 6.5–8.1)

## 2020-03-09 LAB — TYPE AND SCREEN
Unit division: 0
Unit division: 0

## 2020-03-09 LAB — VITAMIN B12: Vitamin B-12: 278 pg/mL (ref 180–914)

## 2020-03-09 LAB — FOLATE: Folate: 21.2 ng/mL (ref >5.9)

## 2020-03-09 LAB — FERRITIN: Ferritin: 4 ng/mL — ABNORMAL LOW (ref 24–336)

## 2020-03-09 LAB — LACTATE DEHYDROGENASE: LDH: 119 U/L (ref 98–192)

## 2020-03-09 LAB — HEMOGLOBIN AND HEMATOCRIT, BLOOD
HCT: 26.7 % — ABNORMAL LOW (ref 39.0–52.0)
Hemoglobin: 7.6 g/dL — ABNORMAL LOW (ref 13.0–17.0)

## 2020-03-09 LAB — PREPARE RBC (CROSSMATCH)

## 2020-03-09 LAB — IRON AND TIBC
Iron: 38 ug/dL — ABNORMAL LOW (ref 45–182)
Saturation Ratios: 8 % — ABNORMAL LOW (ref 17.9–39.5)
TIBC: 496 ug/dL — ABNORMAL HIGH (ref 250–450)
UIBC: 458 ug/dL

## 2020-03-09 LAB — TSH: TSH: 2.378 u[IU]/mL (ref 0.350–4.500)

## 2020-03-09 LAB — HIV ANTIBODY (ROUTINE TESTING W REFLEX): HIV Screen 4th Generation wRfx: NONREACTIVE

## 2020-03-09 LAB — PATHOLOGIST SMEAR REVIEW

## 2020-03-09 MED ORDER — PANTOPRAZOLE SODIUM 40 MG IV SOLR
40.0000 mg | Freq: Two times a day (BID) | INTRAVENOUS | Status: DC
  Administered 2020-03-09 – 2020-03-10 (×3): 40 mg via INTRAVENOUS
  Filled 2020-03-09 (×3): qty 40

## 2020-03-09 MED ORDER — SODIUM CHLORIDE 0.9 % IV SOLN
510.0000 mg | Freq: Once | INTRAVENOUS | Status: AC
  Administered 2020-03-09: 510 mg via INTRAVENOUS
  Filled 2020-03-09: qty 510

## 2020-03-09 MED ORDER — SODIUM CHLORIDE 0.9% IV SOLUTION: Freq: Once | INTRAVENOUS | Status: DC

## 2020-03-09 MED ORDER — PEG 3350-KCL-NA BICARB-NACL 420 G PO SOLR
4000.0000 mL | Freq: Once | ORAL | Status: AC
  Administered 2020-03-09: 4000 mL via ORAL

## 2020-03-09 NOTE — H&P (View-Only) (Signed)
Referring Provider: Dr. Noralee Stain Primary Care Physician:  Pcp, No Primary Gastroenterologist:  Gentry Fitz  Reason for Consultation:  Iron deficiency anemia  HPI: Tyler Hebert is a 64 y.o. male with no pertinent past medical history presenting for consultation of iron deficiency anemia.  Patient reports he has had intermittent chest pain for the last few weeks.  Saw PCP and had labs done which showed hemoglobin 4.9 on 03/05/2020.  No other recent labs on file.  States chest pain improved after transfusion of RBCs. Denies any shortness of breath, fatigue, dizziness, presyncope.  Denies any gastrointestinal complaints.  Denies changes in appetite, early satiety, unexplained weight loss, nausea, vomiting, dysphagia, GERD, abdominal pain, changes in bowel habits, constipation, diarrhea, melena, or hematochezia.  Denies aspirin, NSAID, or blood thinner use.  Denies family history of colon cancer or gastrointestinal malignancy.  He has never had a colonoscopy or EGD.  History reviewed. No pertinent past medical history.  History reviewed. No pertinent surgical history.  Prior to Admission medications   Medication Sig Start Date End Date Taking? Authorizing Provider  acetaminophen (TYLENOL) 500 MG tablet Take 500 mg by mouth every 6 (six) hours as needed.   Yes [provider]  aspirin EC 81 MG tablet Take 81 mg by mouth daily. Swallow whole.   Yes [provider]  Naproxen Sodium (ALEVE) 220 MG CAPS Take 440 mg by mouth daily as needed (pain).   Yes [provider]    Scheduled Meds: . sodium chloride   Intravenous Once  . sodium chloride   Intravenous Once  . polyethylene glycol-electrolytes  4,000 mL Oral Once   Continuous Infusions: PRN Meds:.  Allergies as of 03/08/2020  . (No Known Allergies)    No family history on file.  Social History   Socioeconomic History  . Marital status: Divorced    Spouse name: Not on file  . Number of children: Not  on file  . Years of education: Not on file  . Highest education level: Not on file  Occupational History  . Not on file  Tobacco Use  . Smoking status: Never Smoker  . Smokeless tobacco: Never Used  Substance and Sexual Activity  . Alcohol use: Not on file  . Drug use: Not on file  . Sexual activity: Not on file  Other Topics Concern  . Not on file  Social History Narrative  . Not on file   Social Determinants of Health   Financial Resource Strain:   . Difficulty of Paying Living Expenses: Not on file  Food Insecurity:   . Worried About Programme researcher, broadcasting/film/video in the Last Year: Not on file  . Ran Out of Food in the Last Year: Not on file  Transportation Needs:   . Lack of Transportation (Medical): Not on file  . Lack of Transportation (Non-Medical): Not on file  Physical Activity:   . Days of Exercise per Week: Not on file  . Minutes of Exercise per Session: Not on file  Stress:   . Feeling of Stress : Not on file  Social Connections:   . Frequency of Communication with Friends and Family: Not on file  . Frequency of Social Gatherings with Friends and Family: Not on file  . Attends Religious Services: Not on file  . Active Member of Clubs or Organizations: Not on file  . Attends Banker Meetings: Not on file  . Marital Status: Not on file  Intimate Partner Violence:   . Fear of  Current or Ex-Partner: Not on file  . Emotionally Abused: Not on file  . Physically Abused: Not on file  . Sexually Abused: Not on file    Review of Systems: Review of Systems  Constitutional: Negative for chills, fever, malaise/fatigue and weight loss.  HENT: Negative for hearing loss and tinnitus.   Eyes: Negative for pain and redness.  Respiratory: Negative for cough and shortness of breath.   Cardiovascular: Positive for chest pain. Negative for palpitations.  Gastrointestinal: Negative for abdominal pain, blood in stool, constipation, diarrhea, heartburn, melena, nausea and  vomiting.  Genitourinary: Negative for flank pain and hematuria.  Musculoskeletal: Negative for falls and joint pain.  Skin: Negative for itching and rash.  Neurological: Negative for seizures and loss of consciousness.  Endo/Heme/Allergies: Negative for polydipsia. Does not bruise/bleed easily.  Psychiatric/Behavioral: Negative for substance abuse. The patient is not nervous/anxious.      Physical Exam: Vital signs: Vitals:   03/09/20 0758 03/09/20 0922  BP: 139/70 120/70  Pulse: 72 66  Resp:  16  Temp: 98 F (36.7 C) 98.4 F (36.9 C)  SpO2: 100% 100%   Last BM Date: 03/08/20 Physical Exam Constitutional:      General: He is not in acute distress.    Appearance: Normal appearance.  HENT:     Head: Normocephalic and atraumatic.     Nose: Nose normal.     Mouth/Throat:     Mouth: Mucous membranes are moist.     Pharynx: Oropharynx is clear.  Eyes:     General: No scleral icterus.    Extraocular Movements: Extraocular movements intact.     Comments: Conjunctival pallor  Cardiovascular:     Rate and Rhythm: Normal rate and regular rhythm.     Pulses: Normal pulses.     Heart sounds: Normal heart sounds.  Pulmonary:     Effort: Pulmonary effort is normal. No respiratory distress.     Breath sounds: Normal breath sounds.  Abdominal:     General: Bowel sounds are normal. There is no distension.     Palpations: Abdomen is soft. There is no mass.     Tenderness: There is no abdominal tenderness. There is no guarding or rebound.     Hernia: No hernia is present.  Musculoskeletal:        General: No swelling or tenderness.     Cervical back: Normal range of motion and neck supple.  Skin:    General: Skin is warm and dry.  Neurological:     General: No focal deficit present.     Mental Status: He is alert and oriented to person, place, and time.  Psychiatric:        Mood and Affect: Mood normal.        Behavior: Behavior normal.      GI:  Lab Results: Recent  Labs    03/08/20 1302 03/08/20 2341 03/09/20 0457  WBC 8.4 7.5 7.2  HGB 5.3* 6.6* 6.9*  HCT 20.1* 23.5* 24.0*  PLT 346 303 301   BMET Recent Labs    03/08/20 1302 03/09/20 0457  NA 141 138  K 3.9 4.1  CL 106 105  CO2 23 24  GLUCOSE 123* 104*  BUN 23 17  CREATININE 0.89 0.87  CALCIUM 8.9 8.8*   LFT Recent Labs    03/09/20 0457  PROT 6.8  ALBUMIN 3.6  AST 11*  ALT 11  ALKPHOS 88  BILITOT 1.6*   PT/INR No results for input(s): LABPROT, INR in  the last 72 hours.   Studies/Results: No results found.  Impression: Severe iron deficiency anemia.  No prior EGD or colonoscopy.  No frank bleeding. -Hemoglobin 4.9 on 10/8, 5.3 on arrival, no baseline on file.  He received 2u pRBCs on 10/11 with rise to 6.9.  Today, he received another 1u with rise to 7.6. -Low ferritin (4) -Low iron (38) and iron saturation (8%) with elevated TIBC (496) -Normal renal function: BUN 17/Cr 0.87 -Stable HR and BP  Plan: Recommend proceeding with EGD and colonoscopy tomorrow.  I thoroughly discussed the procedures with the patient to include nature, alternatives, benefits, and risks (including but not limited to bleeding, infection, perforation, anesthesia/cardiac and pulmonary complications).  Patient verbalized understanding and gave verbal consent to proceed with EGD and colonoscopy.  Clear liquid diet with Nulytely prep later today.  NPO after midnight.  Protonix 40 mg IV twice daily.  Continue to monitor H&H with transfusion as needed to maintain hemoglobin greater than 7.  [facility] will follow.    LOS: 0 days   Edrick Kins  PA-C 03/09/2020, 10:42 AM  Contact #  [phone]

## 2020-03-09 NOTE — Consult Note (Signed)
Referring Provider: Dr. Noralee Stain Primary Care Physician:  Pcp, No Primary Gastroenterologist:  Gentry Fitz  Reason for Consultation:  Iron deficiency anemia  HPI: Tyler Hebert is a 64 y.o. male with no pertinent past medical history presenting for consultation of iron deficiency anemia.  Patient reports he has had intermittent chest pain for the last few weeks.  Saw PCP and had labs done which showed hemoglobin 4.9 on 03/05/2020.  No other recent labs on file.  States chest pain improved after transfusion of RBCs. Denies any shortness of breath, fatigue, dizziness, presyncope.  Denies any gastrointestinal complaints.  Denies changes in appetite, early satiety, unexplained weight loss, nausea, vomiting, dysphagia, GERD, abdominal pain, changes in bowel habits, constipation, diarrhea, melena, or hematochezia.  Denies aspirin, NSAID, or blood thinner use.  Denies family history of colon cancer or gastrointestinal malignancy.  He has never had a colonoscopy or EGD.  History reviewed. No pertinent past medical history.  History reviewed. No pertinent surgical history.  Prior to Admission medications   Medication Sig Start Date End Date Taking? Authorizing Provider  acetaminophen (TYLENOL) 500 MG tablet Take 500 mg by mouth every 6 (six) hours as needed.   Yes [provider]  aspirin EC 81 MG tablet Take 81 mg by mouth daily. Swallow whole.   Yes [provider]  Naproxen Sodium (ALEVE) 220 MG CAPS Take 440 mg by mouth daily as needed (pain).   Yes [provider]    Scheduled Meds: . sodium chloride   Intravenous Once  . sodium chloride   Intravenous Once  . polyethylene glycol-electrolytes  4,000 mL Oral Once   Continuous Infusions: PRN Meds:.  Allergies as of 03/08/2020  . (No Known Allergies)    No family history on file.  Social History   Socioeconomic History  . Marital status: Divorced    Spouse name: Not on file  . Number of children: Not  on file  . Years of education: Not on file  . Highest education level: Not on file  Occupational History  . Not on file  Tobacco Use  . Smoking status: Never Smoker  . Smokeless tobacco: Never Used  Substance and Sexual Activity  . Alcohol use: Not on file  . Drug use: Not on file  . Sexual activity: Not on file  Other Topics Concern  . Not on file  Social History Narrative  . Not on file   Social Determinants of Health   Financial Resource Strain:   . Difficulty of Paying Living Expenses: Not on file  Food Insecurity:   . Worried About Programme researcher, broadcasting/film/video in the Last Year: Not on file  . Ran Out of Food in the Last Year: Not on file  Transportation Needs:   . Lack of Transportation (Medical): Not on file  . Lack of Transportation (Non-Medical): Not on file  Physical Activity:   . Days of Exercise per Week: Not on file  . Minutes of Exercise per Session: Not on file  Stress:   . Feeling of Stress : Not on file  Social Connections:   . Frequency of Communication with Friends and Family: Not on file  . Frequency of Social Gatherings with Friends and Family: Not on file  . Attends Religious Services: Not on file  . Active Member of Clubs or Organizations: Not on file  . Attends Banker Meetings: Not on file  . Marital Status: Not on file  Intimate Partner Violence:   . Fear of  Current or Ex-Partner: Not on file  . Emotionally Abused: Not on file  . Physically Abused: Not on file  . Sexually Abused: Not on file    Review of Systems: Review of Systems  Constitutional: Negative for chills, fever, malaise/fatigue and weight loss.  HENT: Negative for hearing loss and tinnitus.   Eyes: Negative for pain and redness.  Respiratory: Negative for cough and shortness of breath.   Cardiovascular: Positive for chest pain. Negative for palpitations.  Gastrointestinal: Negative for abdominal pain, blood in stool, constipation, diarrhea, heartburn, melena, nausea and  vomiting.  Genitourinary: Negative for flank pain and hematuria.  Musculoskeletal: Negative for falls and joint pain.  Skin: Negative for itching and rash.  Neurological: Negative for seizures and loss of consciousness.  Endo/Heme/Allergies: Negative for polydipsia. Does not bruise/bleed easily.  Psychiatric/Behavioral: Negative for substance abuse. The patient is not nervous/anxious.      Physical Exam: Vital signs: Vitals:   03/09/20 0758 03/09/20 0922  BP: 139/70 120/70  Pulse: 72 66  Resp:  16  Temp: 98 F (36.7 C) 98.4 F (36.9 C)  SpO2: 100% 100%   Last BM Date: 03/08/20 Physical Exam Constitutional:      General: He is not in acute distress.    Appearance: Normal appearance.  HENT:     Head: Normocephalic and atraumatic.     Nose: Nose normal.     Mouth/Throat:     Mouth: Mucous membranes are moist.     Pharynx: Oropharynx is clear.  Eyes:     General: No scleral icterus.    Extraocular Movements: Extraocular movements intact.     Comments: Conjunctival pallor  Cardiovascular:     Rate and Rhythm: Normal rate and regular rhythm.     Pulses: Normal pulses.     Heart sounds: Normal heart sounds.  Pulmonary:     Effort: Pulmonary effort is normal. No respiratory distress.     Breath sounds: Normal breath sounds.  Abdominal:     General: Bowel sounds are normal. There is no distension.     Palpations: Abdomen is soft. There is no mass.     Tenderness: There is no abdominal tenderness. There is no guarding or rebound.     Hernia: No hernia is present.  Musculoskeletal:        General: No swelling or tenderness.     Cervical back: Normal range of motion and neck supple.  Skin:    General: Skin is warm and dry.  Neurological:     General: No focal deficit present.     Mental Status: He is alert and oriented to person, place, and time.  Psychiatric:        Mood and Affect: Mood normal.        Behavior: Behavior normal.      GI:  Lab Results: Recent  Labs    03/08/20 1302 03/08/20 2341 03/09/20 0457  WBC 8.4 7.5 7.2  HGB 5.3* 6.6* 6.9*  HCT 20.1* 23.5* 24.0*  PLT 346 303 301   BMET Recent Labs    03/08/20 1302 03/09/20 0457  NA 141 138  K 3.9 4.1  CL 106 105  CO2 23 24  GLUCOSE 123* 104*  BUN 23 17  CREATININE 0.89 0.87  CALCIUM 8.9 8.8*   LFT Recent Labs    03/09/20 0457  PROT 6.8  ALBUMIN 3.6  AST 11*  ALT 11  ALKPHOS 88  BILITOT 1.6*   PT/INR No results for input(s): LABPROT, INR in  the last 72 hours.   Studies/Results: No results found.  Impression: Severe iron deficiency anemia.  No prior EGD or colonoscopy.  No frank bleeding. -Hemoglobin 4.9 on 10/8, 5.3 on arrival, no baseline on file.  He received 2u pRBCs on 10/11 with rise to 6.9.  Today, he received another 1u with rise to 7.6. -Low ferritin (4) -Low iron (38) and iron saturation (8%) with elevated TIBC (496) -Normal renal function: BUN 17/Cr 0.87 -Stable HR and BP  Plan: Recommend proceeding with EGD and colonoscopy tomorrow.  I thoroughly discussed the procedures with the patient to include nature, alternatives, benefits, and risks (including but not limited to bleeding, infection, perforation, anesthesia/cardiac and pulmonary complications).  Patient verbalized understanding and gave verbal consent to proceed with EGD and colonoscopy.  Clear liquid diet with Nulytely prep later today.  NPO after midnight.  Protonix 40 mg IV twice daily.  Continue to monitor H&H with transfusion as needed to maintain hemoglobin greater than 7.  [facility] will follow.    LOS: 0 days   Edrick Kins  PA-C 03/09/2020, 10:42 AM  Contact #  [phone]

## 2020-03-09 NOTE — Plan of Care (Signed)
  Problem: Education: Goal: Knowledge of General Education information will improve Description: Including pain rating scale, medication(s)/side effects and non-pharmacologic comfort measures 03/09/2020 1729 by Epimenio Foot, RN Outcome: Progressing 03/09/2020 1729 by Epimenio Foot, RN Outcome: Progressing

## 2020-03-09 NOTE — Progress Notes (Addendum)
PROGRESS NOTE    Tyler Hebert  UUV:[id number] DOB: 1956/04/30 DOA: 03/08/2020 PCP: Pcp, No     Brief Narrative:  Tyler Hebert is a 64 y.o. male with no past medical history. Presenting with intermittent left arm pit pain over the last several months. It is sharp and radiates across the top of his chest. It lasts about 5 minutes. He thought initially it was because of lifting items at work (he works in Teaching laboratory technician and receiving). So he visited his doctor 3 days ago and they ran blood work and looked at an EKG. His EKG was ok. However, he was called to day and told to got to the ED due to a low hemoglobin (4.9). He denies any aggravating or alleviating factors. He denies any other treatments.    New events last 24 hours / Subjective: Patient has no complaints this morning.  He just finished his third unit packed red blood cell transfusion.  He denies any dizziness or lightheadedness, no longer having any of his left arm/chest pain.  He denies any blood loss that he knows of.  Denies any abdominal pain, nausea, vomiting or diarrhea.  He states that due to his left armpit pain, he has been taking Aleve and baby aspirin over the last 3 days.  He has never seen GI, never evaluated for endoscopy in the past.  Assessment & Plan:   Active Problems:   Symptomatic anemia   Symptomatic anemia with iron deficiency -Transfused total 3 unit packed red blood cells  -Ordered IV Feraheme -FOBT negative and BUN 17, however concern for GI source of anemia due to severe iron deficiency.  GI consulted -planning for EGD and colonoscopy 10/13 -IV Protonix    DVT prophylaxis:  SCDs Start: 03/08/20 2140  Code Status: Full code Family Communication: Family at bedside Disposition Plan:  Status is: Observation  The patient will require care spanning > 2 midnights and should be moved to inpatient because: Ongoing diagnostic testing needed not appropriate for outpatient work up  Dispo: The patient is from: Home               Anticipated d/c is to: Home              Anticipated d/c date is: 1 day              Patient currently is not medically stable to d/c.  Continue to monitor hemoglobin.  GI planning for EGD and colonoscopy 10/13   Consultants:   GI  Procedures:   None  Antimicrobials:  Anti-infectives (From admission, onward)   None        Objective: Vitals:   03/09/20 0530 03/09/20 0551 03/09/20 0758 03/09/20 0922  BP: 129/67 135/73 139/70 120/70  Pulse: 72 70 72 66  Resp: 18 18  16   Temp: 98 F (36.7 C) 98.1 F (36.7 C) 98 F (36.7 C) 98.4 F (36.9 C)  TempSrc: Oral Oral Oral Oral  SpO2: 99% 100% 100% 100%  Weight:      Height:        Intake/Output Summary (Last 24 hours) at 03/09/2020 1109 Last data filed at 03/09/2020 1000 Gross per 24 hour  Intake 1259 ml  Output 1 ml  Net 1258 ml   Filed Weights   03/08/20 2138  Weight: 101.7 kg    Examination:  General exam: Appears calm and comfortable  Respiratory system: Clear to auscultation. Respiratory effort normal. No respiratory distress. No conversational dyspnea.  Cardiovascular system: S1 & S2  heard, RRR. No murmurs. No pedal edema. Gastrointestinal system: Abdomen is nondistended, soft and nontender. Normal bowel sounds heard. Central nervous system: Alert and oriented. No focal neurological deficits. Speech clear.  Extremities: Symmetric in appearance  Skin: No rashes, lesions or ulcers on exposed skin  Psychiatry: Judgement and insight appear normal. Mood & affect appropriate.   Data Reviewed: I have personally reviewed following labs and imaging studies  CBC: Recent Labs  Lab 03/08/20 1302 03/08/20 2341 03/09/20 0457 03/09/20 1031  WBC 8.4 7.5 7.2  --   HGB 5.3* 6.6* 6.9* 7.6*  HCT 20.1* 23.5* 24.0* 26.7*  MCV 59.8* 64.0* 63.8*  --   PLT 346 303 301  --    Basic Metabolic Panel: Recent Labs  Lab 03/08/20 1302 03/09/20 0457  NA 141 138  K 3.9 4.1  CL 106 105  CO2 23 24  GLUCOSE 123* 104*   BUN 23 17  CREATININE 0.89 0.87  CALCIUM 8.9 8.8*   GFR: Estimated Creatinine Clearance: 102.8 mL/min (by C-G formula based on SCr of 0.87 mg/dL). Liver Function Tests: Recent Labs  Lab 03/08/20 1302 03/09/20 0457  AST 11* 11*  ALT 12 11  ALKPHOS 98 88  BILITOT 0.9 1.6*  PROT 7.5 6.8  ALBUMIN 4.2 3.6   No results for input(s): LIPASE, AMYLASE in the last 168 hours. No results for input(s): AMMONIA in the last 168 hours. Coagulation Profile: No results for input(s): INR, PROTIME in the last 168 hours. Cardiac Enzymes: No results for input(s): CKTOTAL, CKMB, CKMBINDEX, TROPONINI in the last 168 hours. BNP (last 3 results) No results for input(s): PROBNP in the last 8760 hours. HbA1C: No results for input(s): HGBA1C in the last 72 hours. CBG: No results for input(s): GLUCAP in the last 168 hours. Lipid Profile: No results for input(s): CHOL, HDL, LDLCALC, TRIG, CHOLHDL, LDLDIRECT in the last 72 hours. Thyroid Function Tests: Recent Labs    03/08/20 2341  TSH 2.378   Anemia Panel: Recent Labs    03/08/20 1302 03/08/20 2341  VITAMINB12  --  278  FOLATE  --  21.2  FERRITIN  --  4*  TIBC  --  496*  IRON  --  38*  RETICCTPCT 2.2  --    Sepsis Labs: No results for input(s): PROCALCITON, LATICACIDVEN in the last 168 hours.  Recent Results (from the past 240 hour(s))  Resp Panel by RT PCR (RSV, Flu A&B, Covid) - Nasopharyngeal Swab     Status: None   Collection Time: 03/08/20  3:36 PM   Specimen: Nasopharyngeal Swab  Result Value Ref Range Status   SARS Coronavirus 2 by RT PCR NEGATIVE NEGATIVE Final    Comment: (NOTE) SARS-CoV-2 target nucleic acids are NOT DETECTED.  The SARS-CoV-2 RNA is generally detectable in upper respiratoy specimens during the acute phase of infection. The lowest concentration of SARS-CoV-2 viral copies this assay can detect is 131 copies/mL. A negative result does not preclude SARS-Cov-2 infection and should not be used as the sole  basis for treatment or other patient management decisions. A negative result may occur with  improper specimen collection/handling, submission of specimen other than nasopharyngeal swab, presence of viral mutation(s) within the areas targeted by this assay, and inadequate number of viral copies (<131 copies/mL). A negative result must be combined with clinical observations, patient history, and epidemiological information. The expected result is Negative.  Fact Sheet for Patients:  https://www.moore.com/  Fact Sheet for Healthcare Providers:  https://www.young.biz/  This test is no t  yet approved or cleared by the [place] and  has been authorized for detection and/or diagnosis of SARS-CoV-2 by FDA under an Emergency Use Authorization (EUA). This EUA will remain  in effect (meaning this test can be used) for the duration of the COVID-19 declaration under Section 564(b)(1) of the Act, 21 U.S.C. section 360bbb-3(b)(1), unless the authorization is terminated or revoked sooner.     Influenza A by PCR NEGATIVE NEGATIVE Final   Influenza B by PCR NEGATIVE NEGATIVE Final    Comment: (NOTE) The Xpert Xpress SARS-CoV-2/FLU/RSV assay is intended as an aid in  the diagnosis of influenza from Nasopharyngeal swab specimens and  should not be used as a sole basis for treatment. Nasal washings and  aspirates are unacceptable for Xpert Xpress SARS-CoV-2/FLU/RSV  testing.  Fact Sheet for Patients: https://www.moore.com/  Fact Sheet for Healthcare Providers: https://www.young.biz/  This test is not yet approved or cleared by the [place] FDA and  has been authorized for detection and/or diagnosis of SARS-CoV-2 by  FDA under an Emergency Use Authorization (EUA). This EUA will remain  in effect (meaning this test can be used) for the duration of the  Covid-19 declaration under Section 564(b)(1) of the  Act, 21  U.S.C. section 360bbb-3(b)(1), unless the authorization is  terminated or revoked.    Respiratory Syncytial Virus by PCR NEGATIVE NEGATIVE Final    Comment: (NOTE) Fact Sheet for Patients: https://www.moore.com/  Fact Sheet for Healthcare Providers: https://www.young.biz/  This test is not yet approved or cleared by the [place] FDA and  has been authorized for detection and/or diagnosis of SARS-CoV-2 by  FDA under an Emergency Use Authorization (EUA). This EUA will remain  in effect (meaning this test can be used) for the duration of the  COVID-19 declaration under Section 564(b)(1) of the Act, 21 U.S.C.  section 360bbb-3(b)(1), unless the authorization is terminated or  revoked. Performed at [facility], [address] [place], [place] [address]       Radiology Studies: No results found.    Scheduled Meds: . sodium chloride   Intravenous Once  . sodium chloride   Intravenous Once  . polyethylene glycol-electrolytes  4,000 mL Oral Once   Continuous Infusions:   LOS: 0 days      Time spent: 35 minutes   Noralee Stain, DO [facility] 03/09/2020, 11:09 AM   Available via Epic secure chat 7am-7pm After these hours, please refer to coverage provider listed on [email or web address]

## 2020-03-09 NOTE — Plan of Care (Signed)

## 2020-03-10 ENCOUNTER — Encounter (HOSPITAL_COMMUNITY)
Admission: EM | Disposition: A | Discharge status: Home / Self Care | Payer: Self-pay | Source: Home / Self Care | Attending: Internal Medicine

## 2020-03-10 ENCOUNTER — Inpatient Hospital Stay (HOSPITAL_COMMUNITY): Payer: Medicare Other | Admitting: Anesthesiology

## 2020-03-10 ENCOUNTER — Encounter (HOSPITAL_COMMUNITY): Payer: Self-pay | Admitting: Internal Medicine

## 2020-03-10 DIAGNOSIS — D509 Iron deficiency anemia, unspecified: Secondary | ICD-10-CM | POA: Diagnosis present

## 2020-03-10 DIAGNOSIS — D649 Anemia, unspecified: Secondary | ICD-10-CM | POA: Diagnosis not present

## 2020-03-10 HISTORY — PX: COLONOSCOPY: SHX5424

## 2020-03-10 HISTORY — PX: ESOPHAGOGASTRODUODENOSCOPY: SHX5428

## 2020-03-10 HISTORY — PX: BIOPSY: SHX5522

## 2020-03-10 HISTORY — PX: POLYPECTOMY: SHX5525

## 2020-03-10 LAB — TYPE AND SCREEN
ABO/RH(D): A POS
Antibody Screen: NEGATIVE
Unit division: 0

## 2020-03-10 LAB — CBC
HCT: 25.5 % — ABNORMAL LOW (ref 39.0–52.0)
Hemoglobin: 7.4 g/dL — ABNORMAL LOW (ref 13.0–17.0)
MCH: 19.2 pg — ABNORMAL LOW (ref 26.0–34.0)
MCHC: 29 g/dL — ABNORMAL LOW (ref 30.0–36.0)
MCV: 66.1 fL — ABNORMAL LOW (ref 80.0–100.0)
Platelets: 288 10*3/uL (ref 150–400)
RBC: 3.86 MIL/uL — ABNORMAL LOW (ref 4.22–5.81)
RDW: 29.6 % — ABNORMAL HIGH (ref 11.5–15.5)
WBC: 9 10*3/uL (ref 4.0–10.5)
nRBC: 0 % (ref 0.0–0.2)

## 2020-03-10 LAB — BPAM RBC
Blood Product Expiration Date: 202110312359
Blood Product Expiration Date: 202110312359
Blood Product Expiration Date: 202111012359
ISSUE DATE / TIME: 202110111503
ISSUE DATE / TIME: 202110111823
ISSUE DATE / TIME: 202110120521
Unit Type and Rh: 6200
Unit Type and Rh: 6200
Unit Type and Rh: 6200

## 2020-03-10 LAB — HAPTOGLOBIN: Haptoglobin: 211 mg/dL (ref 32–363)

## 2020-03-10 SURGERY — EGD (ESOPHAGOGASTRODUODENOSCOPY)
Anesthesia: Monitor Anesthesia Care

## 2020-03-10 MED ORDER — POLYSACCHARIDE IRON COMPLEX 150 MG PO CAPS: 150.0000 mg | ORAL_CAPSULE | Freq: Every day | ORAL | Status: AC

## 2020-03-10 MED ORDER — PROPOFOL 10 MG/ML IV BOLUS
INTRAVENOUS | Status: DC | PRN
  Administered 2020-03-10 (×3): 20 mg via INTRAVENOUS

## 2020-03-10 MED ORDER — CYANOCOBALAMIN 1000 MCG/ML IJ SOLN
1000.0000 ug | Freq: Every day | INTRAMUSCULAR | Status: DC
  Administered 2020-03-10: 1000 ug via SUBCUTANEOUS
  Filled 2020-03-10: qty 1

## 2020-03-10 MED ORDER — LIDOCAINE 2% (20 MG/ML) 5 ML SYRINGE
INTRAMUSCULAR | Status: DC | PRN
  Administered 2020-03-10: 80 mg via INTRAVENOUS

## 2020-03-10 MED ORDER — VITAMIN B-12 1000 MCG PO TABS: 1000.0000 ug | ORAL_TABLET | Freq: Every day | ORAL | Status: AC

## 2020-03-10 MED ORDER — POLYSACCHARIDE IRON COMPLEX 150 MG PO CAPS
150.0000 mg | ORAL_CAPSULE | Freq: Every day | ORAL | Status: DC
  Administered 2020-03-10: 150 mg via ORAL
  Filled 2020-03-10: qty 1

## 2020-03-10 MED ORDER — SENNA 8.6 MG PO TABS: 1.0000 | ORAL_TABLET | Freq: Every day | ORAL | 0 refills | Status: AC

## 2020-03-10 MED ORDER — PROPOFOL 500 MG/50ML IV EMUL
INTRAVENOUS | Status: DC | PRN
  Administered 2020-03-10: 150 ug/kg/min via INTRAVENOUS

## 2020-03-10 MED ORDER — PANTOPRAZOLE SODIUM 40 MG PO TBEC
40.0000 mg | DELAYED_RELEASE_TABLET | Freq: Every day | ORAL | Status: DC
  Administered 2020-03-10: 40 mg via ORAL
  Filled 2020-03-10: qty 1

## 2020-03-10 MED ORDER — LACTATED RINGERS IV SOLN: INTRAVENOUS | Status: DC | PRN

## 2020-03-10 MED ORDER — PANTOPRAZOLE SODIUM 40 MG PO TBEC: 40.0000 mg | DELAYED_RELEASE_TABLET | Freq: Every day | ORAL | 2 refills | Status: AC

## 2020-03-10 NOTE — Progress Notes (Signed)
Pt to be discharged to home this evening. Discharge teaching/Instructions including review of all Medications and schedules for these Medications discussed with Pt.  Pt verbalized understanding of all discharge teaching/Instructions. Discharge packet with Pt at time of discharge

## 2020-03-10 NOTE — Anesthesia Preprocedure Evaluation (Addendum)
Anesthesia Evaluation  Patient identified by MRN, date of birth, ID band Patient awake    Reviewed: Allergy & Precautions, NPO status , Patient's Chart, lab work & pertinent test results  Airway Mallampati: I  TM Distance: >3 FB Neck ROM: Full    Dental  (+) Edentulous Upper, Edentulous Lower   Pulmonary neg pulmonary ROS,    Pulmonary exam normal breath sounds clear to auscultation       Cardiovascular Exercise Tolerance: Good negative cardio ROS   Rhythm:Regular Rate:Normal  EKG reviewed: NSR rate 95   Neuro/Psych negative neurological ROS  negative psych ROS   GI/Hepatic negative GI ROS, Neg liver ROS,   Endo/Other  negative endocrine ROS  Renal/GU negative Renal ROS  negative genitourinary   Musculoskeletal negative musculoskeletal ROS (+)   Abdominal   Peds negative pediatric ROS (+)  Hematology  (+) anemia , Presented with a Hgb of 4.9, now with Hgb 7.4 after 3 units pRBCs   Anesthesia Other Findings   Reproductive/Obstetrics negative OB ROS                            Anesthesia Physical Anesthesia Plan  ASA: III and emergent  Anesthesia Plan: MAC   Post-op Pain Management:    Induction: Intravenous  PONV Risk Score and Plan: 1 and Propofol infusion, Treatment may vary due to age or medical condition and TIVA  Airway Management Planned: Natural Airway and Simple Face Mask  Additional Equipment:   Intra-op Plan:   Post-operative Plan:   Informed Consent: I have reviewed the patients History and Physical, chart, labs and discussed the procedure including the risks, benefits and alternatives for the proposed anesthesia with the patient or authorized representative who has indicated his/her understanding and acceptance.     Dental advisory given  Plan Discussed with: Anesthesiologist and CRNA  Anesthesia Plan Comments:        Anesthesia Quick Evaluation

## 2020-03-10 NOTE — Interval H&P Note (Signed)
History and Physical Interval Note:  03/10/2020 1:06 PM  Tyler Hebert  has presented today for surgery, with the diagnosis of iron deficiency anemia.  The various methods of treatment have been discussed with the patient and family. After consideration of risks, benefits and other options for treatment, the patient has consented to  Procedure(s): ESOPHAGOGASTRODUODENOSCOPY (EGD) (N/A) COLONOSCOPY (N/A) as a surgical intervention.  The patient's history has been reviewed, patient examined, no change in status, stable for surgery.  I have reviewed the patient's chart and labs.  Questions were answered to the patient's satisfaction.     Tyler Hebert

## 2020-03-10 NOTE — Discharge Summary (Signed)
Physician Discharge Summary  Tyler Hebert XHB:[id number] DOB: Nov 14, 1955 DOA: 03/08/2020  PCP: Kaleen Mask, MD  Admit date: 03/08/2020 Discharge date: 03/10/2020  Time spent: 50 minutes  Recommendations for Outpatient Follow-up:  1. Follow-up with Dr. Levora Angel, gastroenterology in 6 weeks.  On follow-up if anemia persist despite iron supplementation will likely need capsule endoscopy for further evaluation as outpatient. 2. Follow-up with Kaleen Mask, MD in 1 to 2 weeks.  On follow-up patient will need a CBC done to follow-up on H&H.   Discharge Diagnoses:  Principal Problem:   Iron deficiency anemia Active Problems:   Symptomatic anemia   Discharge Condition: Stable and improved  Diet recommendation: Heart healthy  Filed Weights   03/08/20 2138 03/10/20 1230  Weight: 101.7 kg 104.3 kg    History of present illness:  HPI per Dr. Laban Emperor is a 64 y.o. male with no past medical history. Presenting with intermittent left arm pit pain over the last several months. It is sharp and radiates across the top of his chest. It lasts about 5 minutes. He thought initially it was because of lifting items at work (he works in Teaching laboratory technician and receiving). So he visited his doctor 3 days ago and they ran blood work and looked at an EKG. His EKG was ok. However, he was called to day and told to got to the ED due to a low hemoglobin (4.9). He denies any aggravating or alleviating factors. He denied any other treatments.    ED Course: CBC showed a Hgb of 5.3 and MCV of 59. He was hemoccult negative. BUN was normal. 2 units pRBCs ordered. TR[facility] called for admission.  Review of Systems:  Reports fatigue with intermittent dyspnea. Denies palpitations, syncopal episodes, hematemesis, hematochezia. Denies family and personal Hx of any blood disorders/cancerReview of systems is otherwise negative for all not mentioned in HPI.   Hospital Course:  1 iron deficiency  anemia/symptomatic anemia Patient had presented with symptomatic anemia with noted severe iron deficiency with a hemoglobin of 4.9 from PCPs office.  Patient denied any overt bleeding.  Anemia panel with iron level of 38, ferritin of 4, folate of 212, vitamin B12 of 278.  Patient transfused 3 units packed red blood cells with hemoglobin stabilizing at 7.4 by day of discharge.  Patient also received a dose of IV Feraheme and was placed on IV PPI.  Patient seen in consultation by gastroenterology and patient underwent upper endoscopy and colonoscopy on  03/10/2020.  Upper endoscopy showed gastritis and small hiatal hernia.  Colonoscopy showed hemorrhoids, polyps in the cecum, transverse colon, descending colon, sigmoid colon that were resected and biopsied.  Also showed diverticulosis.  Patient remained in stable condition.  GI recommended diet be advanced to a soft diet which patient tolerated and to continue oral iron supplementation on discharge in addition to Protonix 40 mg daily for 2 months with outpatient follow-up with GI in 2 months.  Per GI if anemia persist despite iron supplementation will need capsule endoscopy as outpatient for further evaluation.  Patient was discharged in stable and improved condition.     Procedures:  EGD/colonoscopy 03/10/2020 per Dr. Levora Angel  Status post 3 units packed red blood cells  Consultations:  Gastroenterology: Dr. Levora Angel 03/09/2020  Discharge Exam: Vitals:   03/10/20 1425 03/10/20 1459  BP: 120/76 139/69  Pulse: 79 68  Resp: (!) 22 18  Temp:  (!) 97.5 F (36.4 C)  SpO2: 98% 99%    General: NAD Cardiovascular: RRR Respiratory:  CTAB  Discharge Instructions   Discharge Instructions    Diet - low sodium heart healthy   Complete by: As directed    Increase activity slowly   Complete by: As directed      Allergies as of 03/10/2020   No Known Allergies     Medication List    STOP taking these medications   Aleve 220 MG  Caps Generic drug: Naproxen Sodium   aspirin EC 81 MG tablet     TAKE these medications   acetaminophen 500 MG tablet Commonly known as: TYLENOL Take 500 mg by mouth every 6 (six) hours as needed.   iron polysaccharides 150 MG capsule Commonly known as: NIFEREX Take 1 capsule (150 mg total) by mouth daily. Start taking on: March 11, 2020   pantoprazole 40 MG tablet Commonly known as: PROTONIX Take 1 tablet (40 mg total) by mouth daily. Start taking on: March 11, 2020   senna 8.6 MG Tabs tablet Commonly known as: SENOKOT Take 1 tablet (8.6 mg total) by mouth at bedtime.   vitamin B-12 1000 MCG tablet Commonly known as: CYANOCOBALAMIN Take 1 tablet (1,000 mcg total) by mouth daily.      No Known Allergies  Follow-up Information    Brahmbhatt, Parag, MD Follow up in 6 week(s).   Specialty: Gastroenterology Contact information: [address] [place] [address] [place] [address] [phone]        Kaleen Mask, MD. Schedule an appointment as soon as possible for a visit in 2 week(s).   Specialty: Family Medicine Why: f/u in 1-2 weeks. Contact information: [address] [place] [address] [phone]                The results of significant diagnostics from this hospitalization (including imaging, microbiology, ancillary and laboratory) are listed below for reference.    Significant Diagnostic Studies: No results found.  Microbiology: Recent Results (from the past 240 hour(s))  Resp Panel by RT PCR (RSV, Flu A&B, Covid) - Nasopharyngeal Swab     Status: None   Collection Time: 03/08/20  3:36 PM   Specimen: Nasopharyngeal Swab  Result Value Ref Range Status   SARS Coronavirus 2 by RT PCR NEGATIVE NEGATIVE Final    Comment: (NOTE) SARS-CoV-2 target nucleic acids are NOT DETECTED.  The SARS-CoV-2 RNA is generally detectable in upper respiratoy specimens during the acute phase of infection. The lowest concentration of SARS-CoV-2  viral copies this assay can detect is 131 copies/mL. A negative result does not preclude SARS-Cov-2 infection and should not be used as the sole basis for treatment or other patient management decisions. A negative result may occur with  improper specimen collection/handling, submission of specimen other than nasopharyngeal swab, presence of viral mutation(s) within the areas targeted by this assay, and inadequate number of viral copies (<131 copies/mL). A negative result must be combined with clinical observations, patient history, and epidemiological information. The expected result is Negative.  Fact Sheet for Patients:  https://www.moore.com/  Fact Sheet for Healthcare Providers:  https://www.young.biz/  This test is no t yet approved or cleared by the [place] FDA and  has been authorized for detection and/or diagnosis of SARS-CoV-2 by FDA under an Emergency Use Authorization (EUA). This EUA will remain  in effect (meaning this test can be used) for the duration of the COVID-19 declaration under Section 564(b)(1) of the Act, 21 U.S.C. section 360bbb-3(b)(1), unless the authorization is terminated or revoked sooner.     Influenza A by  PCR NEGATIVE NEGATIVE Final   Influenza B by PCR NEGATIVE NEGATIVE Final    Comment: (NOTE) The Xpert Xpress SARS-CoV-2/FLU/RSV assay is intended as an aid in  the diagnosis of influenza from Nasopharyngeal swab specimens and  should not be used as a sole basis for treatment. Nasal washings and  aspirates are unacceptable for Xpert Xpress SARS-CoV-2/FLU/RSV  testing.  Fact Sheet for Patients: https://www.moore.com/  Fact Sheet for Healthcare Providers: https://www.young.biz/  This test is not yet approved or cleared by the [place] FDA and  has been authorized for detection and/or diagnosis of SARS-CoV-2 by  FDA under an Emergency Use Authorization (EUA).  This EUA will remain  in effect (meaning this test can be used) for the duration of the  Covid-19 declaration under Section 564(b)(1) of the Act, 21  U.S.C. section 360bbb-3(b)(1), unless the authorization is  terminated or revoked.    Respiratory Syncytial Virus by PCR NEGATIVE NEGATIVE Final    Comment: (NOTE) Fact Sheet for Patients: https://www.moore.com/  Fact Sheet for Healthcare Providers: https://www.young.biz/  This test is not yet approved or cleared by the [place] FDA and  has been authorized for detection and/or diagnosis of SARS-CoV-2 by  FDA under an Emergency Use Authorization (EUA). This EUA will remain  in effect (meaning this test can be used) for the duration of the  COVID-19 declaration under Section 564(b)(1) of the Act, 21 U.S.C.  section 360bbb-3(b)(1), unless the authorization is terminated or  revoked. Performed at [facility], [address] [place], [place] [address]      Labs: Basic Metabolic Panel: Recent Labs  Lab 03/08/20 1302 03/09/20 0457  NA 141 138  K 3.9 4.1  CL 106 105  CO2 23 24  GLUCOSE 123* 104*  BUN 23 17  CREATININE 0.89 0.87  CALCIUM 8.9 8.8*   Liver Function Tests: Recent Labs  Lab 03/08/20 1302 03/09/20 0457  AST 11* 11*  ALT 12 11  ALKPHOS 98 88  BILITOT 0.9 1.6*  PROT 7.5 6.8  ALBUMIN 4.2 3.6   No results for input(s): LIPASE, AMYLASE in the last 168 hours. No results for input(s): AMMONIA in the last 168 hours. CBC: Recent Labs  Lab 03/08/20 1302 03/08/20 2341 03/09/20 0457 03/09/20 1031 03/10/20 0515  WBC 8.4 7.5 7.2  --  9.0  HGB 5.3* 6.6* 6.9* 7.6* 7.4*  HCT 20.1* 23.5* 24.0* 26.7* 25.5*  MCV 59.8* 64.0* 63.8*  --  66.1*  PLT 346 303 301  --  288   Cardiac Enzymes: No results for input(s): CKTOTAL, CKMB, CKMBINDEX, TROPONINI in the last 168 hours. BNP: BNP (last 3 results) No results for input(s): BNP in the last 8760  hours.  ProBNP (last 3 results) No results for input(s): PROBNP in the last 8760 hours.  CBG: No results for input(s): GLUCAP in the last 168 hours.     Signed:  Ramiro Harvest MD.  [facility] 03/10/2020, 5:53 PM

## 2020-03-10 NOTE — Brief Op Note (Signed)
03/08/2020 - 03/10/2020  2:22 PM  PATIENT:  Tyler Hebert  64 y.o. male  PRE-OPERATIVE DIAGNOSIS:  iron deficiency anemia  POST-OPERATIVE DIAGNOSIS:  EGD: gastric polyps; gastritis Colon: multiple polyps; diverticuli, hemmorhoids  PROCEDURE:  Procedure(s): ESOPHAGOGASTRODUODENOSCOPY (EGD) (N/A) COLONOSCOPY (N/A) BIOPSY POLYPECTOMY  SURGEON:  Surgeon(s) and Role:    * Devri Kreher, MD - Primary  Findings ----------- -EGD showed few gastric polyps and gastritis.   no evidence of active bleeding -Colonoscopy showed normal TI, multiple colon polyps, diverticulosis and hemorrhoids.  No evidence of active bleeding.  Recommendations ------------------------- -Start soft diet and advance as tolerated -Continue iron supplement -Protonix 40 mg once a day for 2 months -If anemia persist despite of iron supplement, recommend capsule endoscopy as an outpatient. -No further inpatient GI work-up planned.  GI will sign off.  Call us back if needed -Follow-up in GI clinic in 2 months  Kathi Der MD, FACP 03/10/2020, 2:23 PM  Contact #  [phone]

## 2020-03-10 NOTE — Transfer of Care (Signed)
Immediate Anesthesia Transfer of Care Note  Patient: Tyler Hebert  Procedure(s) Performed: ESOPHAGOGASTRODUODENOSCOPY (EGD) (N/A ) COLONOSCOPY (N/A ) BIOPSY POLYPECTOMY  Patient Location: Endoscopy Unit  Anesthesia Type:MAC  Level of Consciousness: drowsy and patient cooperative  Airway & Oxygen Therapy: Patient Spontanous Breathing and Patient connected to face mask oxygen  Post-op Assessment: Report given to RN and Post -op Vital signs reviewed and stable  Post vital signs: Reviewed and stable  Last Vitals:  Vitals Value Taken Time  BP 116/54 03/10/20 1415  Temp    Pulse 67 03/10/20 1417  Resp 16 03/10/20 1417  SpO2 100 % 03/10/20 1417  Vitals shown include unvalidated device data.  Last Pain:  Vitals:   03/10/20 1415  TempSrc:   PainSc: 0-No pain         Complications: No complications documented.

## 2020-03-10 NOTE — Op Note (Signed)
[facility] Patient Name: Tyler Hebert Procedure Date: 03/10/2020 MRN: [id number] Attending MD: Kathi Der , MD Date of Birth: [birth year] CSN: [id number] Age: 64 Admit Type: Outpatient Procedure:                Upper GI endoscopy Indications:              Iron deficiency anemia Providers:                Kathi Der, MD, Weston Settle, RN, Charlett Lango, RN, Sunday Corn Mbumina, Technician Referring MD:              Medicines:                Sedation Administered by an Anesthesia Professional Complications:            No immediate complications. Estimated Blood Loss:     Estimated blood loss was minimal. Procedure:                Pre-Anesthesia Assessment:                           - Prior to the procedure, a History and Physical                            was performed, and patient medications and                            allergies were reviewed. The patient's tolerance of                            previous anesthesia was also reviewed. The risks                            and benefits of the procedure and the sedation                            options and risks were discussed with the patient.                            All questions were answered, and informed consent                            was obtained. Prior Anticoagulants: The patient has                            taken no previous anticoagulant or antiplatelet                            agents. ASA Grade Assessment: III - A patient with                            severe systemic disease. After reviewing the risks  and benefits, the patient was deemed in                            satisfactory condition to undergo the procedure.                           After obtaining informed consent, the endoscope was                            passed under direct vision. Throughout the                            procedure, the patient's blood pressure,  pulse, and                            oxygen saturations were monitored continuously. The                            GIF-H190 ([id number]) Olympus gastroscope was                            introduced through the mouth, and advanced to the                            second part of duodenum. The upper GI endoscopy was                            accomplished without difficulty. The patient                            tolerated the procedure well. Scope In: Scope Out: Findings:      The Z-line was variable and was found 39 cm from the incisors.      A small hiatal hernia was present.      Scattered mild inflammation characterized by congestion (edema),       erosions and erythema was found in the prepyloric region of the stomach.       Biopsies were taken with a cold forceps for histology.      Multiple small sessile polyps were found in the gastric body. Biopsies       were taken with a cold forceps for histology.      The duodenal bulb, first portion of the duodenum and second portion of       the duodenum were normal. Impression:               - Z-line variable, 39 cm from the incisors.                           - Small hiatal hernia.                           - Gastritis. Biopsied.                           - Multiple gastric polyps. Biopsied.                           -  Normal duodenal bulb, first portion of the                            duodenum and second portion of the duodenum. Moderate Sedation:      Moderate (conscious) sedation was personally administered by an       anesthesia professional. The following parameters were monitored: oxygen       saturation, heart rate, blood pressure, and response to care. Recommendation:           - Perform a colonoscopy today. Procedure Code(s):        --- Professional ---                           [phone], Esophagogastroduodenoscopy, flexible,                            transoral; with biopsy, single or multiple Diagnosis Code(s):        ---  Professional ---                           K22.8, Other specified diseases of esophagus                           K44.9, Diaphragmatic hernia without obstruction or                            gangrene                           K29.70, Gastritis, unspecified, without bleeding                           K31.7, Polyp of stomach and duodenum                           D50.9, Iron deficiency anemia, unspecified CPT copyright 2019 [employer]. All rights reserved. The codes documented in this report are preliminary and upon coder review may  be revised to meet current compliance requirements. Kathi Der, MD Kathi Der, MD 03/10/2020 2:14:43 PM Number of Addenda: 0

## 2020-03-10 NOTE — Progress Notes (Signed)
PROGRESS NOTE    Tyler Hebert  XMI:[id number] DOB: 07/30/1955 DOA: 03/08/2020 PCP: Pcp, No   Chief Complaint  Patient presents with  . Abnormal Lab    Low Hemaglobin     Brief Narrative: Tyler Hebert a 64 y.o.malewithno past medical history. Presenting with intermittent left arm pit pain over the last several months. It issharpand radiates across the top of his chest. It lasts about 5 minutes. He thought initially it was because of lifting items at work (he works in Teaching laboratory technician and receiving). So he visited his doctor 3 days ago and they ran blood workand looked at an EKG.His EKG was ok. However, he was called to day and told to got to the EDdue to a low hemoglobin (4.9).He denies any aggravating or alleviating factors. He denies any other treatments.Patient subsequently transfused 3 units of packed red blood cells also received a dose of IV Feraheme.  GI consulted and patient for EGD/upper endoscopy today 03/10/2020.   Assessment & Plan:   Principal Problem:   Iron deficiency anemia Active Problems:   Symptomatic anemia  #1 iron deficiency anemia/symptomatic anemia Patient had presented with symptomatic anemia with noted severe iron deficiency with a hemoglobin of 4.9 from PCPs office.  Patient denies any overt bleeding.  Anemia panel with iron level of 38, ferritin of 4, folate of 212, vitamin B12 of 278.  Patient transfused 3 units packed red blood cells with hemoglobin currently at 7.4.  Patient on IV PPI.  Status post IV Feraheme.  Patient seen in consultation by GI and patient for upper endoscopy and colonoscopy today.  We will also placed on vitamin B12 supplementation.  Follow.    DVT prophylaxis: SCDs Code Status: Full Family Communication: Updated patient.  No family at bedside. Disposition:   Status is: Inpatient    Dispo: The patient is from: Home              Anticipated d/c is to: Home              Anticipated d/c date is: Hopefully tomorrow.               Patient currently awaiting EGD/colonoscopy for further evaluation of iron deficiency anemia.       Consultants:   Gastroenterology: Dr. Levora Angel 03/09/2020  Procedures:   EGD/colonoscopy pending  Status post 3 units packed red blood cells    Antimicrobials:   None   Subjective: Ambulating in room.  Denies any chest pain or shortness of breath.  No lightheadedness or dizziness.  Denies any melanotic stools.  No bloody bowel movements.  Feeling much better than he did on admission.  Asking when he is going to be able to go home.  Objective: Vitals:   03/09/20 2038 03/10/20 0448 03/10/20 1204 03/10/20 1230  BP: (!) 154/85 128/67 124/74 (!) 156/63  Pulse: 82 77 80 78  Resp: 20 18 18  (!) 22  Temp: 97.6 F (36.4 C) 98.2 F (36.8 C) 98.2 F (36.8 C) (!) 97.3 F (36.3 C)  TempSrc: Oral Oral Oral Temporal  SpO2: 100% 95% 99% 98%  Weight:    104.3 kg  Height:    5\' 11"  (1.803 m)    Intake/Output Summary (Last 24 hours) at 03/10/2020 1302 Last data filed at 03/09/2020 2209 Gross per 24 hour  Intake 2480 ml  Output --  Net 2480 ml   Filed Weights   03/08/20 2138 03/10/20 1230  Weight: 101.7 kg 104.3 kg    Examination:  General  exam: Appears calm and comfortable  Respiratory system: Clear to auscultation. Respiratory effort normal. Cardiovascular system: S1 & S2 heard, RRR. No JVD, murmurs, rubs, gallops or clicks. No pedal edema. Gastrointestinal system: Abdomen is nondistended, soft and nontender. No organomegaly or masses felt. Normal bowel sounds heard. Central nervous system: Alert and oriented. No focal neurological deficits. Extremities: Symmetric 5 x 5 power. Skin: No rashes, lesions or ulcers Psychiatry: Judgement and insight appear normal. Mood & affect appropriate.     Data Reviewed: I have personally reviewed following labs and imaging studies  CBC: Recent Labs  Lab 03/08/20 1302 03/08/20 2341 03/09/20 0457 03/09/20 1031 03/10/20 0515   WBC 8.4 7.5 7.2  --  9.0  HGB 5.3* 6.6* 6.9* 7.6* 7.4*  HCT 20.1* 23.5* 24.0* 26.7* 25.5*  MCV 59.8* 64.0* 63.8*  --  66.1*  PLT 346 303 301  --  288    Basic Metabolic Panel: Recent Labs  Lab 03/08/20 1302 03/09/20 0457  NA 141 138  K 3.9 4.1  CL 106 105  CO2 23 24  GLUCOSE 123* 104*  BUN 23 17  CREATININE 0.89 0.87  CALCIUM 8.9 8.8*    GFR: Estimated Creatinine Clearance: 104 mL/min (by C-G formula based on SCr of 0.87 mg/dL).  Liver Function Tests: Recent Labs  Lab 03/08/20 1302 03/09/20 0457  AST 11* 11*  ALT 12 11  ALKPHOS 98 88  BILITOT 0.9 1.6*  PROT 7.5 6.8  ALBUMIN 4.2 3.6    CBG: No results for input(s): GLUCAP in the last 168 hours.   Recent Results (from the past 240 hour(s))  Resp Panel by RT PCR (RSV, Flu A&B, Covid) - Nasopharyngeal Swab     Status: None   Collection Time: 03/08/20  3:36 PM   Specimen: Nasopharyngeal Swab  Result Value Ref Range Status   SARS Coronavirus 2 by RT PCR NEGATIVE NEGATIVE Final    Comment: (NOTE) SARS-CoV-2 target nucleic acids are NOT DETECTED.  The SARS-CoV-2 RNA is generally detectable in upper respiratoy specimens during the acute phase of infection. The lowest concentration of SARS-CoV-2 viral copies this assay can detect is 131 copies/mL. A negative result does not preclude SARS-Cov-2 infection and should not be used as the sole basis for treatment or other patient management decisions. A negative result may occur with  improper specimen collection/handling, submission of specimen other than nasopharyngeal swab, presence of viral mutation(s) within the areas targeted by this assay, and inadequate number of viral copies (<131 copies/mL). A negative result must be combined with clinical observations, patient history, and epidemiological information. The expected result is Negative.  Fact Sheet for Patients:  https://www.moore.com/  Fact Sheet for Healthcare Providers:   https://www.young.biz/  This test is no t yet approved or cleared by the [place] FDA and  has been authorized for detection and/or diagnosis of SARS-CoV-2 by FDA under an Emergency Use Authorization (EUA). This EUA will remain  in effect (meaning this test can be used) for the duration of the COVID-19 declaration under Section 564(b)(1) of the Act, 21 U.S.C. section 360bbb-3(b)(1), unless the authorization is terminated or revoked sooner.     Influenza A by PCR NEGATIVE NEGATIVE Final   Influenza B by PCR NEGATIVE NEGATIVE Final    Comment: (NOTE) The Xpert Xpress SARS-CoV-2/FLU/RSV assay is intended as an aid in  the diagnosis of influenza from Nasopharyngeal swab specimens and  should not be used as a sole basis for treatment. Nasal washings and  aspirates are unacceptable for  Xpert Xpress SARS-CoV-2/FLU/RSV  testing.  Fact Sheet for Patients: https://www.moore.com/  Fact Sheet for Healthcare Providers: https://www.young.biz/  This test is not yet approved or cleared by the [place] FDA and  has been authorized for detection and/or diagnosis of SARS-CoV-2 by  FDA under an Emergency Use Authorization (EUA). This EUA will remain  in effect (meaning this test can be used) for the duration of the  Covid-19 declaration under Section 564(b)(1) of the Act, 21  U.S.C. section 360bbb-3(b)(1), unless the authorization is  terminated or revoked.    Respiratory Syncytial Virus by PCR NEGATIVE NEGATIVE Final    Comment: (NOTE) Fact Sheet for Patients: https://www.moore.com/  Fact Sheet for Healthcare Providers: https://www.young.biz/  This test is not yet approved or cleared by the [place] FDA and  has been authorized for detection and/or diagnosis of SARS-CoV-2 by  FDA under an Emergency Use Authorization (EUA). This EUA will remain  in effect (meaning this test can be  used) for the duration of the  COVID-19 declaration under Section 564(b)(1) of the Act, 21 U.S.C.  section 360bbb-3(b)(1), unless the authorization is terminated or  revoked. Performed at [facility], [address] [place], [place] [address]          Radiology Studies: No results found.      Scheduled Meds: . [MAR Hold] sodium chloride   Intravenous Once  . [MAR Hold] sodium chloride   Intravenous Once  . cyanocobalamin  1,000 mcg Subcutaneous Daily  . [MAR Hold] pantoprazole (PROTONIX) IV  40 mg Intravenous Q12H   Continuous Infusions:   LOS: 1 day    Time spent: 40 minutes    Ramiro Harvest, MD [facility]   To contact the attending provider between 7A-7P or the covering provider during after hours 7P-7A, please log into the web site [email or web address] and access using universal [place] password for that web site. If you do not have the password, please call the hospital operator.  03/10/2020, 1:02 PM

## 2020-03-10 NOTE — Op Note (Signed)
[facility] Patient Name: Tyler Hebert Procedure Date: 03/10/2020 MRN: [id number] Attending MD: Kathi Der , MD Date of Birth: [birth year] CSN: [id number] Age: 64 Admit Type: Inpatient Procedure:                Colonoscopy Indications:              This is the patient's first colonoscopy, Iron                            deficiency anemia Providers:                Kathi Der, MD Referring MD:              Medicines:                Sedation Administered by an Anesthesia Professional Complications:            No immediate complications. Estimated Blood Loss:     Estimated blood loss was minimal. Procedure:                Pre-Anesthesia Assessment:                           - Prior to the procedure, a History and Physical                            was performed, and patient medications and                            allergies were reviewed. The patient's tolerance of                            previous anesthesia was also reviewed. The risks                            and benefits of the procedure and the sedation                            options and risks were discussed with the patient.                            All questions were answered, and informed consent                            was obtained. Prior Anticoagulants: The patient has                            taken no previous anticoagulant or antiplatelet                            agents. ASA Grade Assessment: III - A patient with                            severe systemic disease. After reviewing the risks  and benefits, the patient was deemed in                            satisfactory condition to undergo the procedure.                           - Prior to the procedure, a History and Physical                            was performed, and patient medications and                            allergies were reviewed. The patient's tolerance of                             previous anesthesia was also reviewed. The risks                            and benefits of the procedure and the sedation                            options and risks were discussed with the patient.                            All questions were answered, and informed consent                            was obtained. Prior Anticoagulants: The patient has                            taken no previous anticoagulant or antiplatelet                            agents. ASA Grade Assessment: III - A patient with                            severe systemic disease. After reviewing the risks                            and benefits, the patient was deemed in                            satisfactory condition to undergo the procedure.                           After obtaining informed consent, the colonoscope                            was passed under direct vision. Throughout the                            procedure, the patient's blood pressure, pulse, and  oxygen saturations were monitored continuously. The                            [id number]-H190DL ([id number]) Olympus pediatric colonscope                            was introduced through the anus and advanced to the                            the terminal ileum, with identification of the                            appendiceal orifice and IC valve. The colonoscopy                            was performed without difficulty. The patient                            tolerated the procedure well. The quality of the                            bowel preparation was adequate to identify polyps 6                            mm and larger in size. Scope In: 1:45:51 PM Scope Out: 2:08:48 PM Scope Withdrawal Time: 0 hours 14 minutes 53 seconds  Total Procedure Duration: 0 hours 22 minutes 57 seconds  Findings:      Hemorrhoids were found on perianal exam.      The terminal ileum appeared normal.      A 3 mm polyp was found in the cecum. The  polyp was sessile. The polyp       was removed with a cold snare. Resection was complete, but the polyp       tissue was not retrieved.      A 6 mm polyp was found in the transverse colon. The polyp was sessile.       The polyp was removed with a cold snare. Resection and retrieval were       complete.      A 6 mm polyp was found in the descending colon. The polyp was sessile.       The polyp was removed with a cold snare. Resection and retrieval were       complete.      Three pedunculated and sessile polyps were found in the sigmoid colon.       The polyps were 5 to 7 mm in size. These polyps were removed with a hot       snare. Resection and retrieval were complete.      Multiple diverticula were found in the sigmoid colon, transverse colon       and ascending colon.      A small amount of semi-liquid semi-solid stool was found in the       ascending colon and in the cecum, interfering with visualization. Lavage       of the area was performed, resulting in clearance with good       visualization.      Internal hemorrhoids  were found during retroflexion. The hemorrhoids       were medium-sized. Impression:               - Hemorrhoids found on perianal exam.                           - The examined portion of the ileum was normal.                           - One 3 mm polyp in the cecum, removed with a cold                            snare. Complete resection. Polyp tissue not                            retrieved.                           - One 6 mm polyp in the transverse colon, removed                            with a cold snare. Resected and retrieved.                           - One 6 mm polyp in the descending colon, removed                            with a cold snare. Resected and retrieved.                           - Three 5 to 7 mm polyps in the sigmoid colon,                            removed with a hot snare. Resected and retrieved.                           -  Diverticulosis in the sigmoid colon, in the                            transverse colon and in the ascending colon.                           - Internal hemorrhoids. Moderate Sedation:      Moderate (conscious) sedation was personally administered by an       anesthesia professional. The following parameters were monitored: oxygen       saturation, heart rate, blood pressure, and response to care. Recommendation:           - Patient has a contact number available for                            emergencies. The signs and symptoms of potential  delayed complications were discussed with the                            patient. Return to normal activities tomorrow.                            Written discharge instructions were provided to the                            patient.                           - Resume previous diet.                           - Continue present medications.                           - Await pathology results.                           - Repeat colonoscopy in 3 - 5 years for                            surveillance of multiple polyps.                           - Return to my office in 6 weeks.                           - To visualize the small bowel, perform video                            capsule endoscopy if anemia persists, as outpatient. Procedure Code(s):        --- Professional ---                           [phone], Colonoscopy, flexible; with removal of                            tumor(s), polyp(s), or other lesion(s) by snare                            technique Diagnosis Code(s):        --- Professional ---                           K64.8, Other hemorrhoids                           K63.5, Polyp of colon                           D50.9, Iron deficiency anemia, unspecified                           K57.30, Diverticulosis of large intestine without  perforation or abscess without bleeding CPT copyright 2019  American Medical Association. All rights reserved. The codes documented in this report are preliminary and upon coder review may  be revised to meet current compliance requirements. Kathi Der, MD Kathi Der, MD 03/10/2020 2:21:30 PM Number of Addenda: 0

## 2020-03-11 ENCOUNTER — Other Ambulatory Visit: Payer: Self-pay

## 2020-03-11 LAB — SURGICAL PATHOLOGY

## 2020-03-11 NOTE — Anesthesia Postprocedure Evaluation (Signed)
Anesthesia Post Note  Patient: Tyler Hebert  Procedure(s) Performed: ESOPHAGOGASTRODUODENOSCOPY (EGD) (N/A ) COLONOSCOPY (N/A ) BIOPSY POLYPECTOMY     Patient location during evaluation: Endoscopy Anesthesia Type: MAC Level of consciousness: awake and alert Pain management: pain level controlled Vital Signs Assessment: post-procedure vital signs reviewed and stable Respiratory status: spontaneous breathing, nonlabored ventilation and respiratory function stable Cardiovascular status: blood pressure returned to baseline and stable Postop Assessment: no apparent nausea or vomiting Anesthetic complications: no   No complications documented.  Last Vitals:  Vitals:   03/10/20 1425 03/10/20 1459  BP: 120/76 139/69  Pulse: 79 68  Resp: (!) 22 18  Temp:  (!) 36.4 C  SpO2: 98% 99%    Last Pain:  Vitals:   03/10/20 1459  TempSrc: Oral  PainSc:                  Merlinda Frederick

## 2020-03-14 ENCOUNTER — Encounter (HOSPITAL_COMMUNITY): Payer: Self-pay | Admitting: Gastroenterology
